# Patient Record
Sex: Female | Born: 1960 | Race: White | Hispanic: No | Marital: Single | Smoking: Current every day smoker
Health system: Southern US, Community
[De-identification: ages and names within clinical notes are randomized; demographics above are authoritative.]

## PROBLEM LIST (undated history)

## (undated) DIAGNOSIS — I341 Nonrheumatic mitral (valve) prolapse: Secondary | ICD-10-CM

## (undated) DIAGNOSIS — B009 Herpesviral infection, unspecified: Secondary | ICD-10-CM

## (undated) DIAGNOSIS — M1990 Unspecified osteoarthritis, unspecified site: Secondary | ICD-10-CM

## (undated) DIAGNOSIS — G40209 Localization-related (focal) (partial) symptomatic epilepsy and epileptic syndromes with complex partial seizures, not intractable, without status epilepticus: Secondary | ICD-10-CM

## (undated) DIAGNOSIS — G40909 Epilepsy, unspecified, not intractable, without status epilepticus: Secondary | ICD-10-CM

## (undated) HISTORY — PX: NECK SURGERY: SHX720

## (undated) HISTORY — PX: FEMUR FRACTURE SURGERY: SHX633

## (undated) HISTORY — PX: RADICAL HYSTERECTOMY: SHX2283

---

## 2007-02-01 ENCOUNTER — Emergency Department (HOSPITAL_COMMUNITY): Admission: EM | Admit: 2007-02-01 | Discharge: 2007-02-02 | Payer: Self-pay | Admitting: Emergency Medicine

## 2007-08-22 ENCOUNTER — Emergency Department (HOSPITAL_COMMUNITY): Admission: EM | Admit: 2007-08-22 | Discharge: 2007-08-22 | Payer: Self-pay | Admitting: Emergency Medicine

## 2007-09-04 ENCOUNTER — Emergency Department (HOSPITAL_COMMUNITY): Admission: EM | Admit: 2007-09-04 | Discharge: 2007-09-05 | Payer: Self-pay | Admitting: Emergency Medicine

## 2007-09-13 ENCOUNTER — Emergency Department (HOSPITAL_COMMUNITY): Admission: EM | Admit: 2007-09-13 | Discharge: 2007-09-13 | Payer: Self-pay | Admitting: Emergency Medicine

## 2007-09-17 ENCOUNTER — Encounter (INDEPENDENT_AMBULATORY_CARE_PROVIDER_SITE_OTHER): Payer: Self-pay | Admitting: Internal Medicine

## 2007-09-18 ENCOUNTER — Encounter (INDEPENDENT_AMBULATORY_CARE_PROVIDER_SITE_OTHER): Payer: Self-pay | Admitting: Internal Medicine

## 2007-10-13 ENCOUNTER — Encounter (INDEPENDENT_AMBULATORY_CARE_PROVIDER_SITE_OTHER): Payer: Self-pay | Admitting: Internal Medicine

## 2007-10-13 ENCOUNTER — Emergency Department (HOSPITAL_COMMUNITY): Admission: EM | Admit: 2007-10-13 | Discharge: 2007-10-13 | Payer: Self-pay | Admitting: Emergency Medicine

## 2007-10-29 ENCOUNTER — Emergency Department (HOSPITAL_COMMUNITY): Admission: EM | Admit: 2007-10-29 | Discharge: 2007-10-29 | Payer: Self-pay | Admitting: Emergency Medicine

## 2007-11-07 ENCOUNTER — Emergency Department (HOSPITAL_COMMUNITY): Admission: EM | Admit: 2007-11-07 | Discharge: 2007-11-07 | Payer: Self-pay | Admitting: Family Medicine

## 2007-11-08 ENCOUNTER — Ambulatory Visit: Payer: Self-pay | Admitting: Internal Medicine

## 2007-11-16 ENCOUNTER — Emergency Department (HOSPITAL_COMMUNITY): Admission: EM | Admit: 2007-11-16 | Discharge: 2007-11-16 | Payer: Self-pay | Admitting: Emergency Medicine

## 2007-11-21 ENCOUNTER — Emergency Department (HOSPITAL_COMMUNITY): Admission: EM | Admit: 2007-11-21 | Discharge: 2007-11-21 | Payer: Self-pay | Admitting: Family Medicine

## 2007-12-21 ENCOUNTER — Emergency Department (HOSPITAL_COMMUNITY): Admission: EM | Admit: 2007-12-21 | Discharge: 2007-12-21 | Payer: Self-pay | Admitting: Emergency Medicine

## 2008-01-19 ENCOUNTER — Emergency Department (HOSPITAL_COMMUNITY): Admission: EM | Admit: 2008-01-19 | Discharge: 2008-01-19 | Payer: Self-pay | Admitting: Emergency Medicine

## 2008-02-07 ENCOUNTER — Emergency Department (HOSPITAL_COMMUNITY): Admission: EM | Admit: 2008-02-07 | Discharge: 2008-02-08 | Payer: Self-pay | Admitting: Emergency Medicine

## 2008-02-11 ENCOUNTER — Emergency Department (HOSPITAL_COMMUNITY): Admission: EM | Admit: 2008-02-11 | Discharge: 2008-02-11 | Payer: Self-pay | Admitting: Emergency Medicine

## 2008-02-16 ENCOUNTER — Emergency Department (HOSPITAL_COMMUNITY): Admission: EM | Admit: 2008-02-16 | Discharge: 2008-02-16 | Payer: Self-pay | Admitting: Emergency Medicine

## 2008-02-25 ENCOUNTER — Ambulatory Visit: Payer: Self-pay | Admitting: Internal Medicine

## 2008-02-25 LAB — CONVERTED CEMR LAB
Bilirubin Urine: NEGATIVE
Glucose, Urine, Semiquant: NEGATIVE
Ketones, urine, test strip: NEGATIVE
Specific Gravity, Urine: <1.005
pH: 6

## 2008-03-02 ENCOUNTER — Emergency Department (HOSPITAL_COMMUNITY): Admission: EM | Admit: 2008-03-02 | Discharge: 2008-03-02 | Payer: Self-pay | Admitting: Emergency Medicine

## 2008-03-04 ENCOUNTER — Emergency Department (HOSPITAL_COMMUNITY): Admission: EM | Admit: 2008-03-04 | Discharge: 2008-03-05 | Payer: Self-pay | Admitting: Emergency Medicine

## 2008-03-05 ENCOUNTER — Inpatient Hospital Stay (HOSPITAL_COMMUNITY): Admission: EM | Admit: 2008-03-05 | Discharge: 2008-03-09 | Payer: Self-pay | Admitting: *Deleted

## 2008-03-05 ENCOUNTER — Ambulatory Visit: Payer: Self-pay | Admitting: *Deleted

## 2008-03-09 LAB — CONVERTED CEMR LAB
ALT: 8 units/L (ref 0–35)
BUN: 11 mg/dL (ref 6–23)
Basophils Absolute: 0 10*3/uL (ref 0.0–0.1)
CO2: 20 meq/L (ref 19–32)
Calcium: 9.7 mg/dL (ref 8.4–10.5)
Chloride: 106 meq/L (ref 96–112)
Cholesterol: 208 mg/dL — ABNORMAL HIGH (ref 0–200)
Creatinine, Ser: 0.6 mg/dL (ref 0.40–1.20)
Eosinophils Relative: 1 % (ref 0–5)
Glucose, Bld: 79 mg/dL (ref 70–99)
HCT: 47.3 % — ABNORMAL HIGH (ref 36.0–46.0)
Hemoglobin: 15.9 g/dL — ABNORMAL HIGH (ref 12.0–15.0)
Lymphocytes Relative: 44 % (ref 12–46)
Lymphs Abs: 2.4 10*3/uL (ref 0.7–4.0)
Monocytes Absolute: 0.5 10*3/uL (ref 0.1–1.0)
Monocytes Relative: 9 % (ref 3–12)
Neutro Abs: 2.5 10*3/uL (ref 1.7–7.7)
RBC: 4.48 M/uL (ref 3.87–5.11)
Total CHOL/HDL Ratio: 3.1
Triglycerides: 60 mg/dL (ref <150)

## 2008-03-14 ENCOUNTER — Emergency Department (HOSPITAL_COMMUNITY): Admission: EM | Admit: 2008-03-14 | Discharge: 2008-03-14 | Payer: Self-pay | Admitting: Emergency Medicine

## 2009-04-06 ENCOUNTER — Emergency Department (HOSPITAL_COMMUNITY): Admission: EM | Admit: 2009-04-06 | Discharge: 2009-04-06 | Payer: Self-pay | Admitting: Family Medicine

## 2009-05-19 ENCOUNTER — Emergency Department (HOSPITAL_COMMUNITY): Admission: EM | Admit: 2009-05-19 | Discharge: 2009-05-19 | Payer: Self-pay | Admitting: Emergency Medicine

## 2009-06-13 ENCOUNTER — Emergency Department (HOSPITAL_COMMUNITY): Admission: EM | Admit: 2009-06-13 | Discharge: 2009-06-13 | Payer: Self-pay | Admitting: Emergency Medicine

## 2009-06-19 ENCOUNTER — Emergency Department (HOSPITAL_COMMUNITY): Admission: EM | Admit: 2009-06-19 | Discharge: 2009-06-19 | Payer: Self-pay | Admitting: Emergency Medicine

## 2009-06-28 ENCOUNTER — Emergency Department (HOSPITAL_COMMUNITY): Admission: EM | Admit: 2009-06-28 | Discharge: 2009-06-28 | Payer: Self-pay | Admitting: Emergency Medicine

## 2009-07-09 ENCOUNTER — Emergency Department (HOSPITAL_COMMUNITY): Admission: EM | Admit: 2009-07-09 | Discharge: 2009-07-09 | Payer: Self-pay | Admitting: Emergency Medicine

## 2009-07-23 ENCOUNTER — Emergency Department (HOSPITAL_COMMUNITY): Admission: EM | Admit: 2009-07-23 | Discharge: 2009-07-23 | Payer: Self-pay | Admitting: Family Medicine

## 2009-10-04 IMAGING — CR DG CHEST 2V
2 series · 2 of 2 positions shown · non-contrast
Comparison: 12/21/2007.  [DATE] [DATE].

CLINICAL DATA: Disoriented.  Shaking.  History of seizures.

CHEST - 2 VIEW

[w chest pa]
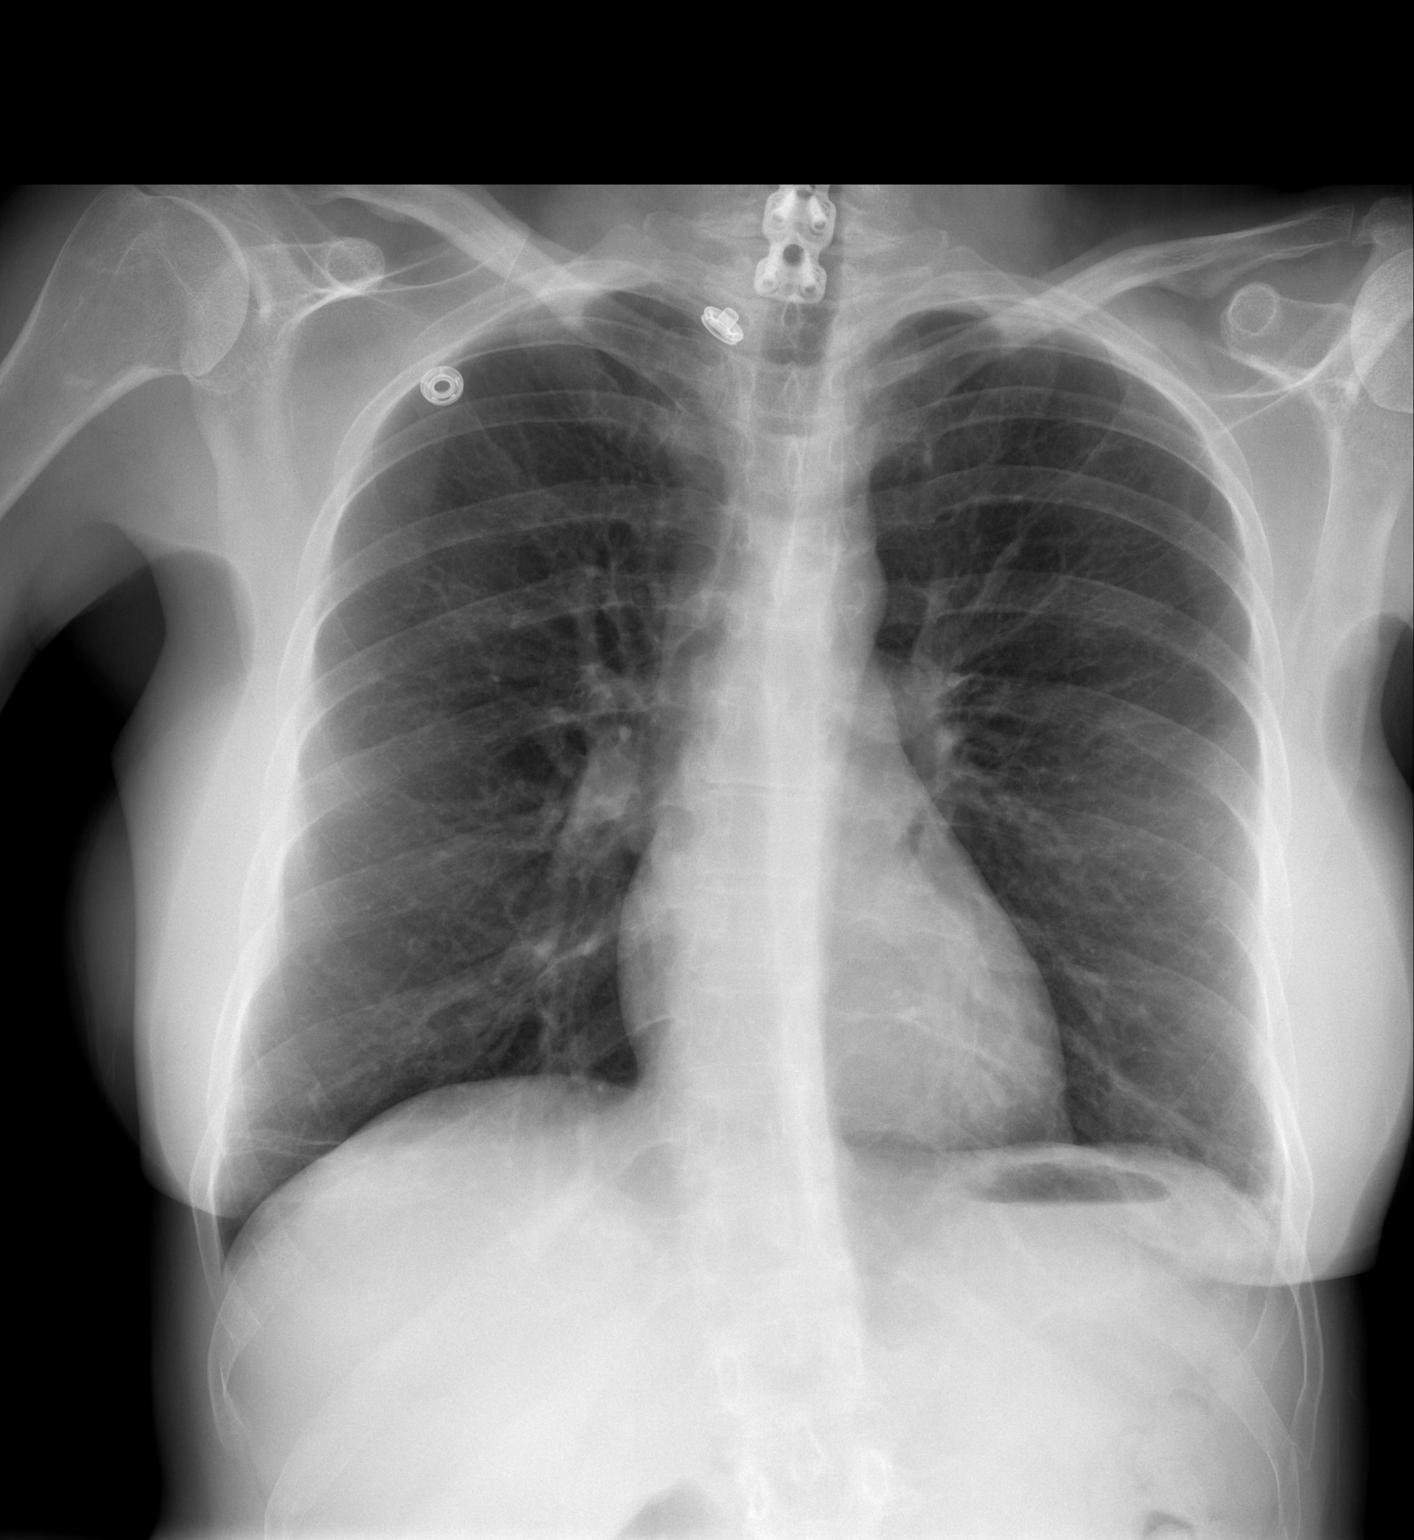

[w chest lat]
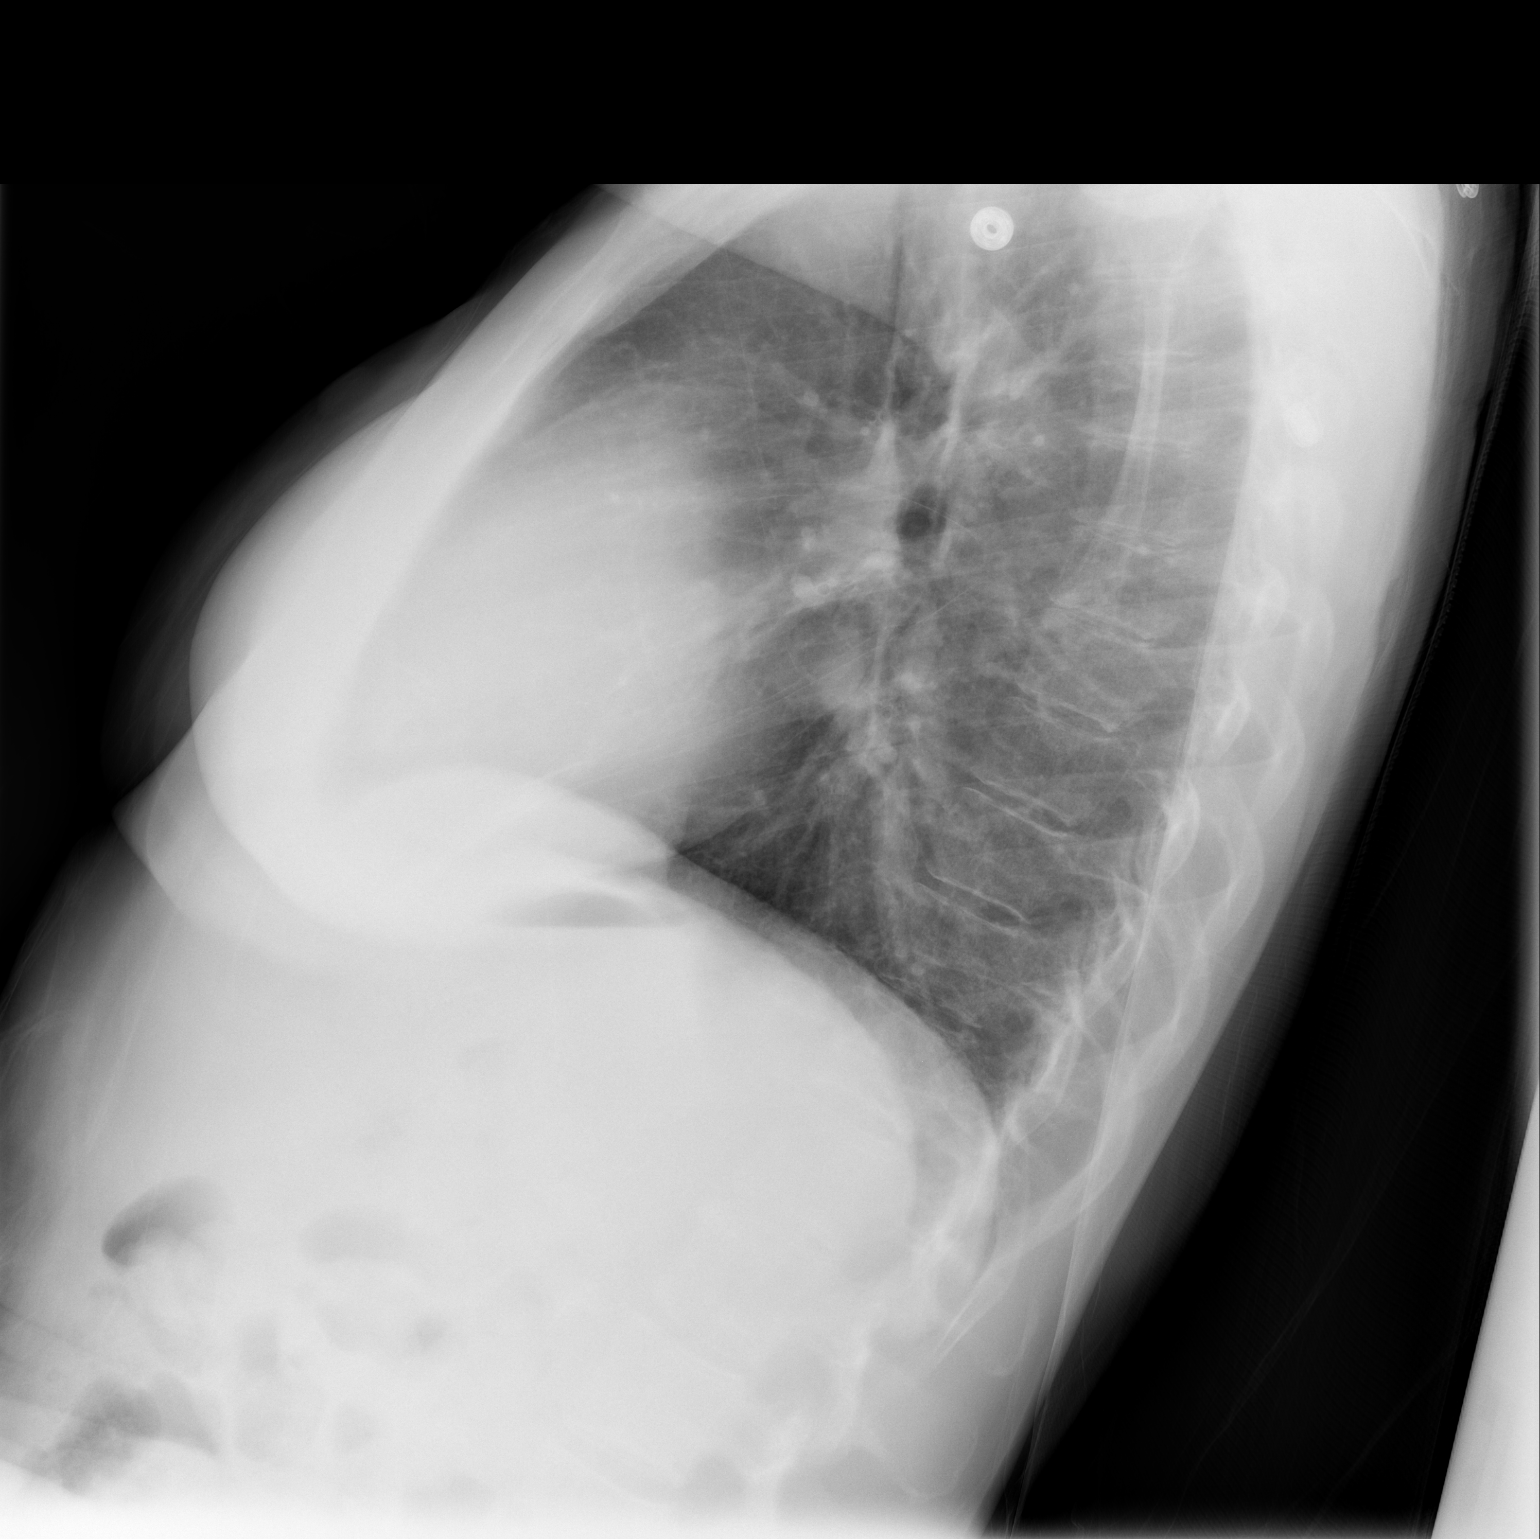

[2 of 2 positions shown; findings below may reference images not displayed]

FINDINGS: Heart size is normal.  The mediastinum is unremarkable.
There is minimal atelectasis at the lung bases.  The lungs are
otherwise clear.  No effusions.  Bony structures unremarkable.
IMPRESSION: Minimal atelectasis at the lung bases.  No active process
otherwise.

## 2010-02-08 NOTE — Letter (Signed)
Summary: PATIENT INFORMATION & HISTORY SHEET  PATIENT INFORMATION & HISTORY SHEET   Imported ByArta Bruce 11/20/2007 09:31:26  _____________________________________________________________________  External Attachment:    Type:   Image     Comment:   External Document

## 2010-02-11 ENCOUNTER — Emergency Department (HOSPITAL_COMMUNITY)
Admission: EM | Admit: 2010-02-11 | Discharge: 2010-02-11 | Disposition: A | Discharge status: Home / Self Care | Payer: Self-pay | Attending: Emergency Medicine | Admitting: Emergency Medicine

## 2010-02-11 DIAGNOSIS — N644 Mastodynia: Secondary | ICD-10-CM | POA: Insufficient documentation

## 2010-02-11 DIAGNOSIS — G8918 Other acute postprocedural pain: Secondary | ICD-10-CM | POA: Insufficient documentation

## 2010-02-11 DIAGNOSIS — Z9889 Other specified postprocedural states: Secondary | ICD-10-CM | POA: Insufficient documentation

## 2010-04-06 ENCOUNTER — Inpatient Hospital Stay (INDEPENDENT_AMBULATORY_CARE_PROVIDER_SITE_OTHER)
Admission: RE | Admit: 2010-04-06 | Discharge: 2010-04-06 | Disposition: A | Discharge status: Home / Self Care | Payer: Self-pay | Source: Ambulatory Visit | Attending: Emergency Medicine | Admitting: Emergency Medicine

## 2010-04-06 DIAGNOSIS — K047 Periapical abscess without sinus: Secondary | ICD-10-CM

## 2010-04-21 LAB — COMPREHENSIVE METABOLIC PANEL
Albumin: 3.2 g/dL — ABNORMAL LOW (ref 3.5–5.2)
BUN: 7 mg/dL (ref 6–23)
CO2: 30 mEq/L (ref 19–32)
Chloride: 97 mEq/L (ref 96–112)
Creatinine, Ser: 0.54 mg/dL (ref 0.4–1.2)
GFR calc non Af Amer: >60 mL/min (ref >60)
Total Bilirubin: 0.6 mg/dL (ref 0.3–1.2)

## 2010-04-21 LAB — DIFFERENTIAL
Basophils Absolute: 0 10*3/uL (ref 0.0–0.1)
Lymphocytes Relative: 15 % (ref 12–46)
Neutro Abs: 5.4 10*3/uL (ref 1.7–7.7)
Neutrophils Relative %: 72 % (ref 43–77)

## 2010-04-21 LAB — CBC
HCT: 40.3 % (ref 36.0–46.0)
MCV: 103.8 fL — ABNORMAL HIGH (ref 78.0–100.0)
Platelets: 110 10*3/uL — ABNORMAL LOW (ref 150–400)
WBC: 7.5 10*3/uL (ref 4.0–10.5)

## 2010-04-25 LAB — POCT PREGNANCY, URINE: Preg Test, Ur: NEGATIVE

## 2010-04-25 LAB — URINE MICROSCOPIC-ADD ON

## 2010-04-25 LAB — DIFFERENTIAL
Basophils Absolute: 0.2 10*3/uL — ABNORMAL HIGH (ref 0.0–0.1)
Basophils Relative: 3 % — ABNORMAL HIGH (ref 0–1)
Eosinophils Absolute: 0 10*3/uL (ref 0.0–0.7)
Monocytes Absolute: 0.5 10*3/uL (ref 0.1–1.0)
Neutro Abs: 4.2 10*3/uL (ref 1.7–7.7)
Neutrophils Relative %: 71 % (ref 43–77)

## 2010-04-25 LAB — POCT I-STAT, CHEM 8
Calcium, Ion: 1.1 mmol/L — ABNORMAL LOW (ref 1.12–1.32)
Chloride: 103 mEq/L (ref 96–112)
HCT: 43 % (ref 36.0–46.0)
Hemoglobin: 14.6 g/dL (ref 12.0–15.0)

## 2010-04-25 LAB — CBC
Hemoglobin: 14.6 g/dL (ref 12.0–15.0)
MCHC: 34.7 g/dL (ref 30.0–36.0)
MCV: 104.4 fL — ABNORMAL HIGH (ref 78.0–100.0)
RDW: 13.7 % (ref 11.5–15.5)

## 2010-04-25 LAB — URINALYSIS, ROUTINE W REFLEX MICROSCOPIC
Nitrite: NEGATIVE
Specific Gravity, Urine: 1.002 — ABNORMAL LOW (ref 1.005–1.030)
Urobilinogen, UA: 0.2 mg/dL (ref 0.0–1.0)

## 2010-04-26 LAB — DIFFERENTIAL
Basophils Absolute: 0 10*3/uL (ref 0.0–0.1)
Basophils Relative: 1 % (ref 0–1)
Eosinophils Absolute: 0.1 10*3/uL (ref 0.0–0.7)
Lymphocytes Relative: 39 % (ref 12–46)
Lymphocytes Relative: 43 % (ref 12–46)
Lymphocytes Relative: 51 % — ABNORMAL HIGH (ref 12–46)
Lymphs Abs: 2.4 10*3/uL (ref 0.7–4.0)
Lymphs Abs: 2.4 10*3/uL (ref 0.7–4.0)
Monocytes Absolute: 0.5 10*3/uL (ref 0.1–1.0)
Monocytes Relative: 8 % (ref 3–12)
Neutro Abs: 3.1 10*3/uL (ref 1.7–7.7)
Neutro Abs: 1.4 10*3/uL — ABNORMAL LOW (ref 1.7–7.7)
Neutro Abs: 2.4 10*3/uL (ref 1.7–7.7)
Neutrophils Relative %: 51 % (ref 43–77)
Neutrophils Relative %: 31 % — ABNORMAL LOW (ref 43–77)
Neutrophils Relative %: 42 % — ABNORMAL LOW (ref 43–77)

## 2010-04-26 LAB — POCT I-STAT, CHEM 8
BUN: <3 mg/dL — ABNORMAL LOW (ref 6–23)
Chloride: 100 mEq/L (ref 96–112)
Creatinine, Ser: 0.7 mg/dL (ref 0.4–1.2)
Glucose, Bld: 89 mg/dL (ref 70–99)
HCT: 47 % — ABNORMAL HIGH (ref 36.0–46.0)
Potassium: 3.8 mEq/L (ref 3.5–5.1)

## 2010-04-26 LAB — POCT PREGNANCY, URINE: Preg Test, Ur: NEGATIVE

## 2010-04-26 LAB — HEPATIC FUNCTION PANEL
ALT: 11 U/L (ref 0–35)
Alkaline Phosphatase: 37 U/L — ABNORMAL LOW (ref 39–117)
Bilirubin, Direct: 0.2 mg/dL (ref 0.0–0.3)
Indirect Bilirubin: 0.4 mg/dL (ref 0.3–0.9)
Total Bilirubin: 0.6 mg/dL (ref 0.3–1.2)

## 2010-04-26 LAB — CBC
Hemoglobin: 15.3 g/dL — ABNORMAL HIGH (ref 12.0–15.0)
MCHC: 34.6 g/dL (ref 30.0–36.0)
MCHC: 34.2 g/dL (ref 30.0–36.0)
MCV: 105.3 fL — ABNORMAL HIGH (ref 78.0–100.0)
Platelets: 149 10*3/uL — ABNORMAL LOW (ref 150–400)
Platelets: 144 10*3/uL — ABNORMAL LOW (ref 150–400)
RBC: 4.29 MIL/uL (ref 3.87–5.11)
RBC: 3.61 MIL/uL — ABNORMAL LOW (ref 3.87–5.11)
RDW: 14.1 % (ref 11.5–15.5)
WBC: 6.2 10*3/uL (ref 4.0–10.5)
WBC: 5.6 10*3/uL (ref 4.0–10.5)
WBC: 4.9 10*3/uL (ref 4.0–10.5)

## 2010-04-26 LAB — URINE CULTURE
Colony Count: NO GROWTH
Culture: NO GROWTH

## 2010-04-26 LAB — RAPID URINE DRUG SCREEN, HOSP PERFORMED
Barbiturates: NOT DETECTED
Benzodiazepines: POSITIVE — AB
Cocaine: POSITIVE — AB
Opiates: POSITIVE — AB

## 2010-04-26 LAB — URINALYSIS, ROUTINE W REFLEX MICROSCOPIC
Glucose, UA: NEGATIVE mg/dL
Ketones, ur: NEGATIVE mg/dL
Nitrite: NEGATIVE
Specific Gravity, Urine: 1.004 — ABNORMAL LOW (ref 1.005–1.030)
Urobilinogen, UA: 0.2 mg/dL (ref 0.0–1.0)
pH: 7.5 (ref 5.0–8.0)
pH: 6 (ref 5.0–8.0)

## 2010-04-26 LAB — BASIC METABOLIC PANEL
BUN: 6 mg/dL (ref 6–23)
CO2: 31 mEq/L (ref 19–32)
Calcium: 8.5 mg/dL (ref 8.4–10.5)
Calcium: 8.5 mg/dL (ref 8.4–10.5)
Creatinine, Ser: 0.59 mg/dL (ref 0.4–1.2)
Creatinine, Ser: 0.61 mg/dL (ref 0.4–1.2)
GFR calc Af Amer: >60 mL/min (ref >60)
GFR calc Af Amer: >60 mL/min (ref >60)
GFR calc non Af Amer: >60 mL/min (ref >60)
GFR calc non Af Amer: >60 mL/min (ref >60)
Sodium: 144 mEq/L (ref 135–145)

## 2010-04-26 LAB — ETHANOL: Alcohol, Ethyl (B): <5 mg/dL (ref 0–10)

## 2010-04-26 LAB — FOLATE: Folate: 9.3 ng/mL

## 2010-04-26 LAB — TSH: TSH: 0.267 u[IU]/mL — ABNORMAL LOW (ref 0.350–4.500)

## 2010-04-26 LAB — VALPROIC ACID LEVEL: Valproic Acid Lvl: 86 ug/mL (ref 50.0–100.0)

## 2010-05-23 ENCOUNTER — Ambulatory Visit
Admission: RE | Admit: 2010-05-23 | Discharge: 2010-05-23 | Disposition: A | Discharge status: Home / Self Care | Payer: No Typology Code available for payment source | Source: Ambulatory Visit | Attending: Internal Medicine | Admitting: Internal Medicine

## 2010-05-23 ENCOUNTER — Other Ambulatory Visit: Payer: Self-pay | Admitting: Internal Medicine

## 2010-05-23 DIAGNOSIS — M549 Dorsalgia, unspecified: Secondary | ICD-10-CM
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
## 2010-05-29 ENCOUNTER — Emergency Department (HOSPITAL_COMMUNITY)
Admission: EM | Admit: 2010-05-29 | Discharge: 2010-05-29 | Disposition: A | Discharge status: Home / Self Care | Payer: Self-pay | Attending: Emergency Medicine | Admitting: Emergency Medicine

## 2010-05-29 DIAGNOSIS — G8929 Other chronic pain: Secondary | ICD-10-CM | POA: Insufficient documentation

## 2010-05-29 DIAGNOSIS — M549 Dorsalgia, unspecified: Secondary | ICD-10-CM | POA: Insufficient documentation

## 2010-05-29 DIAGNOSIS — G40909 Epilepsy, unspecified, not intractable, without status epilepticus: Secondary | ICD-10-CM | POA: Insufficient documentation

## 2010-06-06 ENCOUNTER — Emergency Department (HOSPITAL_COMMUNITY)
Admission: EM | Admit: 2010-06-06 | Discharge: 2010-06-06 | Disposition: A | Discharge status: Home / Self Care | Payer: Self-pay | Attending: Emergency Medicine | Admitting: Emergency Medicine

## 2010-06-06 DIAGNOSIS — F329 Major depressive disorder, single episode, unspecified: Secondary | ICD-10-CM | POA: Insufficient documentation

## 2010-06-06 DIAGNOSIS — IMO0002 Reserved for concepts with insufficient information to code with codable children: Secondary | ICD-10-CM | POA: Insufficient documentation

## 2010-06-06 DIAGNOSIS — G40909 Epilepsy, unspecified, not intractable, without status epilepticus: Secondary | ICD-10-CM | POA: Insufficient documentation

## 2010-06-06 DIAGNOSIS — M545 Low back pain, unspecified: Secondary | ICD-10-CM | POA: Insufficient documentation

## 2010-06-06 DIAGNOSIS — Z79899 Other long term (current) drug therapy: Secondary | ICD-10-CM | POA: Insufficient documentation

## 2010-06-06 DIAGNOSIS — F3289 Other specified depressive episodes: Secondary | ICD-10-CM | POA: Insufficient documentation

## 2010-06-06 DIAGNOSIS — H6690 Otitis media, unspecified, unspecified ear: Secondary | ICD-10-CM | POA: Insufficient documentation

## 2010-06-06 DIAGNOSIS — M546 Pain in thoracic spine: Secondary | ICD-10-CM | POA: Insufficient documentation

## 2010-08-11 ENCOUNTER — Emergency Department (HOSPITAL_COMMUNITY): Payer: Self-pay

## 2010-08-11 ENCOUNTER — Emergency Department (HOSPITAL_COMMUNITY)
Admission: EM | Admit: 2010-08-11 | Discharge: 2010-08-12 | Disposition: A | Discharge status: Home / Self Care | Payer: Self-pay | Attending: Emergency Medicine | Admitting: Emergency Medicine

## 2010-08-11 DIAGNOSIS — S0083XA Contusion of other part of head, initial encounter: Secondary | ICD-10-CM | POA: Insufficient documentation

## 2010-08-11 DIAGNOSIS — IMO0002 Reserved for concepts with insufficient information to code with codable children: Secondary | ICD-10-CM | POA: Insufficient documentation

## 2010-08-11 DIAGNOSIS — S8010XA Contusion of unspecified lower leg, initial encounter: Secondary | ICD-10-CM | POA: Insufficient documentation

## 2010-08-11 DIAGNOSIS — Y9229 Other specified public building as the place of occurrence of the external cause: Secondary | ICD-10-CM | POA: Insufficient documentation

## 2010-08-11 DIAGNOSIS — R569 Unspecified convulsions: Secondary | ICD-10-CM | POA: Insufficient documentation

## 2010-08-11 DIAGNOSIS — S0003XA Contusion of scalp, initial encounter: Secondary | ICD-10-CM | POA: Insufficient documentation

## 2010-08-11 DIAGNOSIS — S01309A Unspecified open wound of unspecified ear, initial encounter: Secondary | ICD-10-CM | POA: Insufficient documentation

## 2010-08-11 DIAGNOSIS — S060X1A Concussion with loss of consciousness of 30 minutes or less, initial encounter: Secondary | ICD-10-CM | POA: Insufficient documentation

## 2010-08-11 LAB — POCT I-STAT, CHEM 8
BUN: 8 mg/dL (ref 6–23)
Chloride: 101 mEq/L (ref 96–112)
HCT: 45 % (ref 36.0–46.0)
Potassium: 3.8 mEq/L (ref 3.5–5.1)
Sodium: 134 mEq/L — ABNORMAL LOW (ref 135–145)
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
## 2010-09-03 ENCOUNTER — Emergency Department (HOSPITAL_COMMUNITY)
Admission: EM | Admit: 2010-09-03 | Discharge: 2010-09-04 | Disposition: A | Discharge status: Home / Self Care | Payer: Self-pay | Attending: Emergency Medicine | Admitting: Emergency Medicine

## 2010-09-03 ENCOUNTER — Emergency Department (HOSPITAL_COMMUNITY): Payer: Self-pay

## 2010-09-03 DIAGNOSIS — F1010 Alcohol abuse, uncomplicated: Secondary | ICD-10-CM | POA: Insufficient documentation

## 2010-09-03 DIAGNOSIS — R5381 Other malaise: Secondary | ICD-10-CM | POA: Insufficient documentation

## 2010-09-03 DIAGNOSIS — R569 Unspecified convulsions: Secondary | ICD-10-CM | POA: Insufficient documentation

## 2010-09-03 DIAGNOSIS — R5383 Other fatigue: Secondary | ICD-10-CM | POA: Insufficient documentation

## 2010-09-04 LAB — URINALYSIS, ROUTINE W REFLEX MICROSCOPIC
Nitrite: NEGATIVE
Specific Gravity, Urine: 1.003 — ABNORMAL LOW (ref 1.005–1.030)
Urobilinogen, UA: 0.2 mg/dL (ref 0.0–1.0)
pH: 5.5 (ref 5.0–8.0)

## 2010-09-04 LAB — CBC
HCT: 42.3 % (ref 36.0–46.0)
Hemoglobin: 14.6 g/dL (ref 12.0–15.0)
RBC: 4.12 MIL/uL (ref 3.87–5.11)
RDW: 14.6 % (ref 11.5–15.5)
WBC: 5 10*3/uL (ref 4.0–10.5)

## 2010-09-04 LAB — POCT PREGNANCY, URINE: Preg Test, Ur: NEGATIVE

## 2010-09-04 LAB — URINE MICROSCOPIC-ADD ON

## 2010-09-04 LAB — RAPID URINE DRUG SCREEN, HOSP PERFORMED
Amphetamines: NOT DETECTED
Barbiturates: POSITIVE — AB
Benzodiazepines: NOT DETECTED
Cocaine: NOT DETECTED
Tetrahydrocannabinol: NOT DETECTED

## 2010-09-04 LAB — DIFFERENTIAL
Basophils Absolute: 0 10*3/uL (ref 0.0–0.1)
Eosinophils Relative: 2 % (ref 0–5)
Lymphocytes Relative: 50 % — ABNORMAL HIGH (ref 12–46)
Neutro Abs: 1.9 10*3/uL (ref 1.7–7.7)
Neutrophils Relative %: 37 % — ABNORMAL LOW (ref 43–77)

## 2010-09-04 LAB — COMPREHENSIVE METABOLIC PANEL
ALT: 20 U/L (ref 0–35)
AST: 26 U/L (ref 0–37)
Albumin: 3.4 g/dL — ABNORMAL LOW (ref 3.5–5.2)
Alkaline Phosphatase: 84 U/L (ref 39–117)
CO2: 27 mEq/L (ref 19–32)
Glucose, Bld: 83 mg/dL (ref 70–99)
Total Bilirubin: 0.1 mg/dL — ABNORMAL LOW (ref 0.3–1.2)
Total Protein: 6 g/dL (ref 6.0–8.3)

## 2010-09-04 LAB — ACETAMINOPHEN LEVEL: Acetaminophen (Tylenol), Serum: <15 ug/mL (ref 10–30)

## 2010-09-04 LAB — ETHANOL: Alcohol, Ethyl (B): 239 mg/dL — ABNORMAL HIGH (ref 0–11)

## 2010-09-09 ENCOUNTER — Emergency Department (HOSPITAL_COMMUNITY)
Admission: EM | Admit: 2010-09-09 | Discharge: 2010-09-09 | Disposition: A | Discharge status: Home / Self Care | Payer: Self-pay | Attending: Emergency Medicine | Admitting: Emergency Medicine

## 2010-09-09 DIAGNOSIS — M545 Low back pain, unspecified: Secondary | ICD-10-CM | POA: Insufficient documentation

## 2010-09-09 DIAGNOSIS — G35 Multiple sclerosis: Secondary | ICD-10-CM | POA: Insufficient documentation

## 2010-09-09 DIAGNOSIS — G40909 Epilepsy, unspecified, not intractable, without status epilepticus: Secondary | ICD-10-CM | POA: Insufficient documentation

## 2010-09-09 DIAGNOSIS — M549 Dorsalgia, unspecified: Secondary | ICD-10-CM | POA: Insufficient documentation

## 2010-09-09 DIAGNOSIS — F1010 Alcohol abuse, uncomplicated: Secondary | ICD-10-CM | POA: Insufficient documentation

## 2010-10-02 ENCOUNTER — Emergency Department (HOSPITAL_COMMUNITY)
Admission: EM | Admit: 2010-10-02 | Discharge: 2010-10-02 | Disposition: A | Discharge status: Home / Self Care | Payer: Self-pay | Attending: Emergency Medicine | Admitting: Emergency Medicine

## 2010-10-02 DIAGNOSIS — Z760 Encounter for issue of repeat prescription: Secondary | ICD-10-CM | POA: Insufficient documentation

## 2010-10-02 DIAGNOSIS — M62838 Other muscle spasm: Secondary | ICD-10-CM | POA: Insufficient documentation

## 2010-10-02 DIAGNOSIS — G40909 Epilepsy, unspecified, not intractable, without status epilepticus: Secondary | ICD-10-CM | POA: Insufficient documentation

## 2010-10-11 LAB — HIV ANTIBODY (ROUTINE TESTING W REFLEX): HIV: NONREACTIVE

## 2010-10-12 LAB — POCT I-STAT, CHEM 8
Calcium, Ion: 1.15
Chloride: 105
Glucose, Bld: 84
HCT: 43
Hemoglobin: 14.6
TCO2: 27

## 2010-10-29 ENCOUNTER — Inpatient Hospital Stay (HOSPITAL_COMMUNITY)
Admission: EM | Admit: 2010-10-29 | Discharge: 2010-11-01 | DRG: 101 | Disposition: A | Discharge status: Home / Self Care | Payer: Self-pay | Attending: Internal Medicine | Admitting: Internal Medicine

## 2010-10-29 ENCOUNTER — Emergency Department (HOSPITAL_COMMUNITY): Payer: Self-pay

## 2010-10-29 DIAGNOSIS — E876 Hypokalemia: Secondary | ICD-10-CM | POA: Diagnosis present

## 2010-10-29 DIAGNOSIS — Z5900 Homelessness unspecified: Secondary | ICD-10-CM

## 2010-10-29 DIAGNOSIS — R112 Nausea with vomiting, unspecified: Secondary | ICD-10-CM | POA: Diagnosis present

## 2010-10-29 DIAGNOSIS — R0682 Tachypnea, not elsewhere classified: Secondary | ICD-10-CM | POA: Diagnosis present

## 2010-10-29 DIAGNOSIS — F329 Major depressive disorder, single episode, unspecified: Secondary | ICD-10-CM | POA: Diagnosis present

## 2010-10-29 DIAGNOSIS — A498 Other bacterial infections of unspecified site: Secondary | ICD-10-CM | POA: Diagnosis present

## 2010-10-29 DIAGNOSIS — G40309 Generalized idiopathic epilepsy and epileptic syndromes, not intractable, without status epilepticus: Principal | ICD-10-CM | POA: Diagnosis present

## 2010-10-29 DIAGNOSIS — N390 Urinary tract infection, site not specified: Secondary | ICD-10-CM | POA: Diagnosis present

## 2010-10-29 DIAGNOSIS — F10939 Alcohol use, unspecified with withdrawal, unspecified: Secondary | ICD-10-CM | POA: Diagnosis present

## 2010-10-29 DIAGNOSIS — R000 Tachycardia, unspecified: Secondary | ICD-10-CM | POA: Diagnosis present

## 2010-10-29 DIAGNOSIS — F1020 Alcohol dependence, uncomplicated: Secondary | ICD-10-CM | POA: Diagnosis present

## 2010-10-29 DIAGNOSIS — R030 Elevated blood-pressure reading, without diagnosis of hypertension: Secondary | ICD-10-CM | POA: Diagnosis present

## 2010-10-29 DIAGNOSIS — R51 Headache: Secondary | ICD-10-CM | POA: Diagnosis present

## 2010-10-29 DIAGNOSIS — F1010 Alcohol abuse, uncomplicated: Secondary | ICD-10-CM | POA: Diagnosis present

## 2010-10-29 DIAGNOSIS — Z9119 Patient's noncompliance with other medical treatment and regimen: Secondary | ICD-10-CM

## 2010-10-29 DIAGNOSIS — F3289 Other specified depressive episodes: Secondary | ICD-10-CM | POA: Diagnosis present

## 2010-10-29 DIAGNOSIS — F1910 Other psychoactive substance abuse, uncomplicated: Secondary | ICD-10-CM | POA: Diagnosis present

## 2010-10-29 DIAGNOSIS — F17200 Nicotine dependence, unspecified, uncomplicated: Secondary | ICD-10-CM | POA: Diagnosis present

## 2010-10-29 DIAGNOSIS — F10239 Alcohol dependence with withdrawal, unspecified: Secondary | ICD-10-CM | POA: Diagnosis present

## 2010-10-29 DIAGNOSIS — Z91199 Patient's noncompliance with other medical treatment and regimen due to unspecified reason: Secondary | ICD-10-CM

## 2010-10-29 LAB — DIFFERENTIAL
Basophils Relative: 0 % (ref 0–1)
Eosinophils Absolute: 0 10*3/uL (ref 0.0–0.7)
Eosinophils Relative: 0 % (ref 0–5)
Lymphs Abs: 0.9 10*3/uL (ref 0.7–4.0)
Neutrophils Relative %: 82 % — ABNORMAL HIGH (ref 43–77)

## 2010-10-29 LAB — CBC
MCV: 98.8 fL (ref 78.0–100.0)
Platelets: 152 10*3/uL (ref 150–400)
RBC: 4.13 MIL/uL (ref 3.87–5.11)
RDW: 14.9 % (ref 11.5–15.5)
WBC: 8.3 10*3/uL (ref 4.0–10.5)

## 2010-10-29 LAB — COMPREHENSIVE METABOLIC PANEL
Albumin: 3.6 g/dL (ref 3.5–5.2)
BUN: 14 mg/dL (ref 6–23)
Calcium: 8.5 mg/dL (ref 8.4–10.5)
GFR calc Af Amer: >90 mL/min (ref >90)
Glucose, Bld: 126 mg/dL — ABNORMAL HIGH (ref 70–99)
Sodium: 134 mEq/L — ABNORMAL LOW (ref 135–145)
Total Protein: 6.3 g/dL (ref 6.0–8.3)

## 2010-10-29 LAB — POCT I-STAT TROPONIN I: Troponin i, poc: 0.01 ng/mL (ref 0.00–0.08)

## 2010-10-29 LAB — PHENOBARBITAL LEVEL: Phenobarbital: 14.3 ug/mL — ABNORMAL LOW (ref 15.0–40.0)

## 2010-10-30 ENCOUNTER — Inpatient Hospital Stay (HOSPITAL_COMMUNITY): Payer: Self-pay

## 2010-10-30 LAB — COMPREHENSIVE METABOLIC PANEL
ALT: 15 U/L (ref 0–35)
AST: 39 U/L — ABNORMAL HIGH (ref 0–37)
Albumin: 2.7 g/dL — ABNORMAL LOW (ref 3.5–5.2)
Alkaline Phosphatase: 101 U/L (ref 39–117)
Potassium: 3.2 mEq/L — ABNORMAL LOW (ref 3.5–5.1)
Sodium: 135 mEq/L (ref 135–145)
Total Protein: 5.1 g/dL — ABNORMAL LOW (ref 6.0–8.3)

## 2010-10-30 LAB — CBC
MCHC: 35.6 g/dL (ref 30.0–36.0)
Platelets: 126 10*3/uL — ABNORMAL LOW (ref 150–400)
RDW: 15 % (ref 11.5–15.5)
WBC: 8.9 10*3/uL (ref 4.0–10.5)

## 2010-10-30 LAB — RAPID URINE DRUG SCREEN, HOSP PERFORMED
Amphetamines: NOT DETECTED
Benzodiazepines: NOT DETECTED
Opiates: NOT DETECTED

## 2010-10-30 LAB — URINALYSIS, ROUTINE W REFLEX MICROSCOPIC
Glucose, UA: NEGATIVE mg/dL
Ketones, ur: NEGATIVE mg/dL
pH: 6 (ref 5.0–8.0)

## 2010-10-30 LAB — MAGNESIUM: Magnesium: 1.5 mg/dL (ref 1.5–2.5)

## 2010-10-30 LAB — DIFFERENTIAL
Basophils Absolute: 0 10*3/uL (ref 0.0–0.1)
Basophils Relative: 0 % (ref 0–1)
Eosinophils Relative: 1 % (ref 0–5)
Monocytes Absolute: 0.7 10*3/uL (ref 0.1–1.0)

## 2010-10-30 LAB — MRSA PCR SCREENING: MRSA by PCR: NEGATIVE

## 2010-10-30 LAB — URINE MICROSCOPIC-ADD ON

## 2010-10-31 ENCOUNTER — Inpatient Hospital Stay (HOSPITAL_COMMUNITY): Payer: Self-pay

## 2010-10-31 ENCOUNTER — Other Ambulatory Visit (HOSPITAL_COMMUNITY): Payer: Self-pay

## 2010-10-31 ENCOUNTER — Encounter (HOSPITAL_COMMUNITY): Payer: Self-pay

## 2010-10-31 LAB — URINE CULTURE: Special Requests: NEGATIVE

## 2010-10-31 LAB — BASIC METABOLIC PANEL
CO2: 24 mEq/L (ref 19–32)
Chloride: 104 mEq/L (ref 96–112)
GFR calc non Af Amer: >90 mL/min (ref >90)
Glucose, Bld: 102 mg/dL — ABNORMAL HIGH (ref 70–99)
Potassium: 3.2 mEq/L — ABNORMAL LOW (ref 3.5–5.1)
Sodium: 136 mEq/L (ref 135–145)

## 2010-10-31 NOTE — Consult Note (Signed)
NAMEVON, INSCOE NO.:  [id number]  MEDICAL RECORD NO.:  [id number]  LOCATION:  1231                         FACILITY:  [facility]  PHYSICIAN:  Beryle Beams, MD   DATE OF BIRTH:  Jul 11, 1960  DATE OF CONSULTATION:  10/29/2010 DATE OF DISCHARGE:                                CONSULTATION   HISTORY:  Ms. Ritter is a 50 year old female with a history of epilepsy, depression, alcohol, and tobacco abuse who is undergoing evaluation for recurrent seizures.  History is according to conservation with Dr. Blake Divine as well as review of the hospital chart.  Ms. Breau currently lives with her boyfriend in a tent.  Apparently, her epilepsy is well controlled with phenobarbital; however, she apparently ran out of this about a week ago.  Over the past week, her boyfriend indicates she has had intermittent episodes where she may twitch and fall, etc.  Today, she indicates she was in the tent and had an episode where she stiffened up, fell to ground, and began jerking all over.  He, at that time summoned 911 and she was taken to [facility]. During the episode she apparently bit her tongue and also bruised her right jaw area region.  In the emergency room, she underwent further testing including a phenobarbital level that was noted to be low at 14.3 and alcohol level less than 11.  Liver function tests including AST and ALT were normal, as well as normal alkaline phosphatase.  A head CT was obtained that was noted to be unremarkable without any significant abnormality.  Neurology was now consulted for further evaluation.  At the time of my dictation, she apparently had a last seizure almost 7 hours ago.  PAST MEDICAL HISTORY:  Significant for: 1. Epilepsy. 2. Depression. 3. Substance abuse. 4. Alcohol abuse. 5. Tobacco abuse. There is no noted history of diabetes, prior stroke, kidney or lung problems.  PAST SURGICAL HISTORY:  Includes: 1. Hysterectomy. 2.  Back surgery. 3. Ankle surgery. 4. Left leg surgery.  MEDICATIONS:  At this time include: 1. Folate. 2. Ativan. 3. Magnesium sulfate. 4. Ondansetron. 5. Phenobarbital 97.2 mg b.i.d. 6. Thiamine. 7. Hydralazine. 8. Oxycodone.  ALLERGIES:  No known drug allergies.  SOCIAL HISTORY:  She resides in [place] area.  She continues to smoke cigarettes and consumes alcohol on a daily basis.  FAMILY HISTORY:  Significant for coronary artery disease.  REVIEW OF SYSTEMS:  Neurological review of systems are as above.  PHYSICAL EXAMINATION:  GENERAL: Reveals a slightly disheveled-appearing middle-aged white female who appears older than her quoted age of 31. VITAL SIGNS: She is currently afebrile. Vital signs are stable. Blood pressure was 160/100. NEUROLOGIC:  She is alert and oriented with fluent speech.  She is able to name, repeat, and follow commands and otherwise, high-intellectual functioning appeared to be intact.  Her extraocular movements were intact.  Pupils equal, round, reactive to light.  Face is symmetric. Palate elevated symmetrically.  Tongue protrude in midline, otherwise cranial nerves II through XII appear to be intact.  On motor examination, she appears to have normal bulk and tone, 5/5 strength. Sensory exam reveals symmetric pinprick and vibration.  Cerebellum exam, on finger-to-nose there is no evidence of pronator drift.  Deep tendon reflexes are grade 1+.  The toes are ongoing. CARDIOVASCULAR:  Regular rate and rhythm without murmur, rub, or gallop. No evidence of carotid bruits. EXTREMITIES:  No evidence of cyanosis, clubbing, or edema. ABDOMEN:  Soft, nontender, nondistended with no active bowel sounds. LUNGS:  Clear to auscultation. SKIN:  Reveals no obvious cuts or abrasions; however, she does have a bruise over the right chin area.  Furthermore, there appears to be ecchymotic area of the tongue.  LABORATORY STUDIES:  Include low alcohol level less than  11.  A phenobarbital level of 14.3 as noted above.  Her CBC and chemistry-7 otherwise were mostly unremarkable.  ASSESSMENT:  Epilepsy with breakthrough seizures due to medication noncompliance.  DISCUSSION:  At this time, Ms. Dittman presents with no history of epilepsy disease, well controlled with phenobarbital; however, she has recently had breakthrough seizures that coincides with a subtherapeutic phenobarbital level.  She apparently has not had any phenobarbital in the past week, but apparently continues to consume alcohol.  Her neurologic examination at this time is nonfocal.  PLAN:  I agree with restarting phenobarbital, although this is probably not the best option with a history of alcohol abuse, it may be the best for financial compliance.  Additionally, I am not certain as to whether or not this could have been also an alcohol withdrawal seizure given her low alcohol level.  I agree with DT precautions.  She does not drive, so this should not be an issue.  Thank you very much for allowing me to participate in the care of this interesting and pleasant patient.         ______________________________ Beryle Beams, MD    RY/MEDQ  D:  10/29/2010  T:  10/30/2010  Job:  [id number]  Electronically Signed by Beryle Beams MD on 10/31/2010 07:33:25 AM

## 2010-11-01 LAB — URINE CULTURE: Special Requests: NEGATIVE

## 2010-11-01 LAB — BASIC METABOLIC PANEL
Calcium: 8.9 mg/dL (ref 8.4–10.5)
Chloride: 105 mEq/L (ref 96–112)
Creatinine, Ser: 0.58 mg/dL (ref 0.50–1.10)
GFR calc Af Amer: >90 mL/min (ref >90)

## 2010-11-01 NOTE — H&P (Signed)
Sandra Francis, Sandra Francis                  ACCOUNT NO.:  [id number]  MEDICAL RECORD NO.:  [id number]  LOCATION:  [facility]                         FACILITY:  [facility]  PHYSICIAN:  Kathlen Mody, MD       DATE OF BIRTH:  1960-02-16  DATE OF ADMISSION:  10/29/2010 DATE OF DISCHARGE:                             HISTORY & PHYSICAL   PRIMARY CARE PHYSICIAN:  MD at [facility].  CHIEF COMPLAINT:  Seizures this afternoon.  HISTORY OF PRESENT ILLNESS:  A 50 year old lady with history of epilepsy, depression, alcohol abuse, tobacco abuse,  was brought in by ambulance for generalized seizures this afternoon.  History was provided by the patient and by the patient's friend at the bedside.  As per the patient, the patient ran out of her phenobarbital about a week ago, and she has been having multiple seizures for 1 week, and today as per the patient's friend who was at the bedside, she had a blank stare, and she tried to get up, became stiff and fell on the back and she was trashing her extremities, she bit her tongue.  At that time, the patient's friend called 911 and brought her to [facility] ED.  The patient now reports postictal headache.  The patient also reports persistent nausea for about a week, and multiple episodes of vomiting yesterday.  The patient also is a heavy alcohol user and her last alcohol was this morning about a glass of wine.  She denies any chest pain, shortness of breath, fever, or chills.  Denies syncopal episodes other than the 1 that she had today.  She denies any blurry vision.  Her headache is mostly frontal headache associated with slight nausea.  REVIEW OF SYSTEMS:  See HPI, otherwise negative.  PAST MEDICAL HISTORY: 1. Epilepsy. 2. Depression. 3. Substance abuse. 4. Alcohol abuse. 5. Tobacco abuse.  PAST SURGICAL HISTORY: 1. Hysterectomy. 2. Back surgery. 3. Ankle surgery. 4. Left leg surgery.  ALLERGIES:  No known drug allergies.  FAMILY HISTORY:  Coronary  artery disease.  HOME MEDICATIONS: 1. Phenobarbital 100 mg b.i.d. 2. Gabapentin. 3. Prozac.  SOCIAL HISTORY:  The patient lives with her friend, continues to smoke cigarettes.  Takes alcohol daily.  Denies any recreational drug use, is unemployed.  PHYSICAL EXAMINATION:  VITAL SIGNS: The patient is afebrile, blood pressure 160/100, pulse ranging between 110 to 120, respiratory rate of 22, saturating about 96% on 2 L of oxygen. GENERAL: She is alert, afebrile, in mild distress and is having minor tremors in all 4 extremities and head. HEENT EXAM: Pupils reacting to light.  No JVD.  No scleral icterus.  Dry mucous membranes.  She has a bruise on the right side of her chin. CARDIOVASCULAR: S1-S2 heard, tachycardic.  No murmurs, rubs, or gallops. RESPIRATORY EXAM:  Chest clear to auscultation bilaterally. ABDOMEN:  Soft, nontender, nondistended.  Bowel sounds are heard. EXTREMITIES: No pedal edema.  PERTINENT LABORATORY DATA:  The patient had a CBC significant for MCHC of 36.3.  Point-of-care troponins negative.  Comprehensive metabolic panel significant for a sodium of 134, potassium of 3.4, glucose of 126. LFTs within normal limits.  Alcohol level less than 11.  RADIOLOGY:  The patient had a CT head without contrast, that is still pending.  ASSESSMENT AND PLAN:  This is a 50 year old lady with significant history of epilepsy, who reports being out of her medications for over a week, reports multiple episodes of seizure activity and the seizure activity that happened today appears to be a generalized tonoclonic seizure.  First, we will load the patient with IV phenobarbital 10 mg and start her on p.o. 100 mg b.i.d., IV Ativan 1-2 mg q.2 hours for seizures as needed.  Next, we will call neurology consult in a.m. for further recommendations.  Order an EEG.  We will also order a urine drug screen since she has a history of substance abuse.  Alcohol abuse.  The patient currently is  tachycardic, tachypneic with elevated blood pressure.  We will initiate CIWA protocol and put her on thiamine and folic acid.  Depression.  The patient currently states she is not depressed, no suicidal ideation.  Continue with her Prozac.  Persistent nausea and vomiting/dehydration.  We will hydrate the patient with IV fluids normal saline 100 mL/h.  Give her IV Zofran as needed for nausea and symptomatically treat her.  Tachycardia, tachypnea, elevated blood pressure.  The patient is most likely in alcohol withdrawal.  She was started on CIWA protocol and further recommendations as per her clinical course.  For DVT prophylaxis, SCDs.  The patient is full code.          ______________________________ Kathlen Mody, MD     VA/MEDQ  D:  10/29/2010  T:  10/29/2010  Job:  [id number]  Electronically Signed by Kathlen Mody MD on 11/01/2010 03:05:32 PM

## 2010-11-08 NOTE — Discharge Summary (Signed)
NAMEGENOVA, KINER                  ACCOUNT NO.:  [id number]  MEDICAL RECORD NO.:  [id number]  LOCATION:  1508                         FACILITY:  [facility]  PHYSICIAN:  Clydia Llano, MD       DATE OF BIRTH:  February 27, 1960  DATE OF ADMISSION:  10/29/2010 DATE OF DISCHARGE:  11/01/2010                              DISCHARGE SUMMARY   PRIMARY CARE PHYSICIAN:  Patient is set up to see [facility], Dr. Andrey Campanile.  REASON FOR ADMISSION:  Seizure.  DISCHARGE DIAGNOSES: 1. Seizure, now controlled. 2. Depression. 3. Substance abuse. 4. Alcohol abuse. 5. Tobacco abuse. 6. Facial trauma secondary to a fall from seizure. 7. Escherichia coli urinary tract infection.  DISCHARGE MEDICATIONS: 1. Ciprofloxacin 500 mg p.o. b.i.d. for 3 more days. 2. Oxycodone 5/325 mg 1 to 2 tablets every 8 hours for pain, only 20     pills given in prescription, no refills. 3. Gabapentin 300 mg 1 capsule 3 times a day. 4. Klonopin 1 mg b.i.d. as needed for seizure or anxiety only 10 pills     given in prescription. 5. Multivitamin with minerals 1 tablet daily. 6. Phenobarbital 100 mg b.i.d. 7. Prozac 20 mg 2 capsules every morning.  BRIEF HISTORY AND EXAMINATION:  Mrs. Sandra Francis is a 50 year old lady with epilepsy, depression, alcohol abuse, brought to the Emergency Department after generalized tonic-clonic seizure.  Patient at the time of admission had a friend at bedside.  As per him, patient ran out of her phenobarbital about a week ago and she has been having multiple seizures for 1 week.  She said she had blank stare and then she tried to get up, became still, fell back, and she had shaking in her extremities.  She bit her tongue and she fell on her face.  Patient reported no postictal headache at the time of admission, this is when it initially started.  RADIOLOGY: 1. CT head without contrast showed no acute intracranial     abnormalities. 2. CT head without contrast at the time of admission on October 20,  no     significant abnormalities identified.  BRIEF HOSPITAL COURSE: 1. Tonic-clonic seizure on background of epilepsy.  Patient said to     have epilepsy.  Patient has tonic-clonic seizure and admitted to     the hospital, started back with the phenobarbital, treated with     Ativan aggressively.  Patient did not have any seizure-like     activity since been in the hospital.  At the time of discharge,     patient was put back on her phenobarbital as well as her Klonopin     as needed for any shakes or seizures. 2. E. coli UTI, which is pansensitive.  Patient was put on Cipro for a     total of 5 days.  Already got 2 days in the hospital here, and she     will have 3 days after discharge. 3. Hypokalemia.  This is replaced aggressively. 4. Polysubstance abuse.  Her UDS showed THC and barbiturates. Patient     was counseled about that. 5. Alcohol abuse.  Patient said she drinks about 4 to 6 cans of 42  ounce of beer daily.  What patient states is that she is trying to     self medicate for seizures.  Patient and her friend are describing     the beer as alcohol withdrawal.  He said they have to drink in the     morning otherwise will start to shake every day.  But while patient     is in the hospital here, this initially was thought to be a big     problem because they mentioned that the very next day, they start     to shake.  Patient was watched over 3 days in the hospital here,     did not have any symptoms related to alcohol withdrawal.  Her blood     pressure okay.  No tachycardia and she is not shaking.  Patient is     walking around in the hallways with the nurses very safely without     any assistance.  Patient was felt safe to be discharged home. 6. Social issues.  Patient suffers from homelessness.  She lives in a     tent.  Social worker was involved and resources in [place]     were given.  Patient elected to go back to her tent with her     friend.  Although I  talked to them about cold weather, and of     course the coming winter.  Patient also was set up with     [facility].  Medication will be dispensed from the hospital until     the patient sees [facility] and get the medication for free over     there.     Clydia Llano, MD     ME/MEDQ  D:  11/01/2010  T:  11/02/2010  Job:  [id number]  cc:   [facility]  Electronically Signed by Clydia Llano  on 11/08/2010 11:07:47 AM

## 2010-11-13 ENCOUNTER — Emergency Department (HOSPITAL_COMMUNITY)
Admission: EM | Admit: 2010-11-13 | Discharge: 2010-11-14 | Disposition: A | Discharge status: Home / Self Care | Payer: Self-pay | Attending: Emergency Medicine | Admitting: Emergency Medicine

## 2010-11-13 ENCOUNTER — Encounter (HOSPITAL_COMMUNITY): Payer: Self-pay | Admitting: Emergency Medicine

## 2010-11-13 DIAGNOSIS — M7989 Other specified soft tissue disorders: Secondary | ICD-10-CM | POA: Insufficient documentation

## 2010-11-13 DIAGNOSIS — Z79899 Other long term (current) drug therapy: Secondary | ICD-10-CM | POA: Insufficient documentation

## 2010-11-13 DIAGNOSIS — R296 Repeated falls: Secondary | ICD-10-CM | POA: Insufficient documentation

## 2010-11-13 DIAGNOSIS — S62502A Fracture of unspecified phalanx of left thumb, initial encounter for closed fracture: Secondary | ICD-10-CM

## 2010-11-13 DIAGNOSIS — S62609A Fracture of unspecified phalanx of unspecified finger, initial encounter for closed fracture: Secondary | ICD-10-CM | POA: Insufficient documentation

## 2010-11-13 DIAGNOSIS — G40909 Epilepsy, unspecified, not intractable, without status epilepticus: Secondary | ICD-10-CM | POA: Insufficient documentation

## 2010-11-13 HISTORY — DX: Herpesviral infection, unspecified: B00.9

## 2010-11-13 HISTORY — DX: Nonrheumatic mitral (valve) prolapse: I34.1

## 2010-11-13 HISTORY — DX: Epilepsy, unspecified, not intractable, without status epilepticus: G40.909

## 2010-11-13 HISTORY — DX: Localization-related (focal) (partial) symptomatic epilepsy and epileptic syndromes with complex partial seizures, not intractable, without status epilepticus: G40.209

## 2010-11-13 HISTORY — DX: Unspecified osteoarthritis, unspecified site: M19.90
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
## 2010-11-14 ENCOUNTER — Encounter (HOSPITAL_COMMUNITY): Payer: Self-pay

## 2010-11-14 ENCOUNTER — Emergency Department (HOSPITAL_COMMUNITY): Payer: Self-pay

## 2010-11-14 MED ORDER — PHENOBARBITAL 97.2 MG PO TABS: 97.2000 mg | ORAL_TABLET | Freq: Two times a day (BID) | ORAL | Status: DC

## 2010-11-14 MED ORDER — OXYCODONE-ACETAMINOPHEN 5-325 MG PO TABS
2.0000 | ORAL_TABLET | Freq: Once | ORAL | Status: AC
  Administered 2010-11-14: 2 via ORAL

## 2010-11-14 MED ORDER — NAPROXEN 500 MG PO TABS: 500.0000 mg | ORAL_TABLET | Freq: Two times a day (BID) | ORAL | Status: DC

## 2010-11-14 MED ORDER — OXYCODONE-ACETAMINOPHEN 5-325 MG PO TABS
ORAL_TABLET | ORAL | Status: AC
  Administered 2010-11-14: 2 via ORAL
  Filled 2010-11-14: qty 2

## 2010-11-14 MED ORDER — VALACYCLOVIR HCL 1 G PO TABS: 1000.0000 mg | ORAL_TABLET | Freq: Three times a day (TID) | ORAL | Status: AC

## 2010-11-14 MED ORDER — PHENOBARBITAL 100 MG PO TABS
100.0000 mg | ORAL_TABLET | Freq: Once | ORAL | Status: AC
  Administered 2010-11-14: 97.2 mg via ORAL

## 2010-11-14 NOTE — ED Notes (Signed)
Pt here with left thumb pain.  Pt states she had seizure and hurt hand.  Pt also would like to be seen for herpes.

## 2010-11-14 NOTE — ED Provider Notes (Signed)
History     CSN: [id number] Arrival date & time: 11/13/2010  8:21 PM   None     Chief Complaint  Patient presents with  . Hand Pain    (Consider location/radiation/quality/duration/timing/severity/associated sxs/prior treatment) HPI Comments: 50 y/o female with hx of seizure d/o - has somewhat frequent seizures - had one yestrday - fell on her L hand and hurt the L thumb - ahs been in constant pain and can't bend 2/2 pain.  Denies numbness or change in color - also had tongue biting but no other c/o.  She is out of her seizure meds, and has ongoing herpes infection of the genitals.  She is out of this medicines also.  Sx are constant and mild, worse with palpation and moving the thumb.  Patient is a 50 y.o. female presenting with hand pain. The history is provided by the patient and medical records.  Hand Pain    Past Medical History  Diagnosis Date  . Seizure disorder, complex partial   . Herpes   . Prolapse of mitral valve     No past surgical history on file.  No family history on file.  History  Substance Use Topics  . Smoking status: Not on file  . Smokeless tobacco: Not on file  . Alcohol Use: Yes     pt smells of etoh     OB History    Grav Para Term Preterm Abortions TAB SAB Ect Mult Living                  Review of Systems  All other systems reviewed and are negative.    Allergies  Review of patient's allergies indicates no known allergies.  Home Medications   Current Outpatient Rx  Name Route Sig Dispense Refill  . ACYCLOVIR PO Oral Take by mouth.      . FLUOXETINE HCL 40 MG PO CAPS Oral Take 40 mg by mouth daily.      Marland Kitchen NAPROXEN 500 MG PO TABS Oral Take 1 tablet (500 mg total) by mouth 2 (two) times daily with a meal. 30 tablet 0  . PHENOBARBITAL 97.2 MG PO TABS Oral Take 97.2 mg by mouth 2 (two) times daily.      Marland Kitchen PHENOBARBITAL 97.2 MG PO TABS Oral Take 1 tablet (97.2 mg total) by mouth 2 (two) times daily. 60 tablet 1  . VALACYCLOVIR HCL 1  G PO TABS Oral Take 1 tablet (1,000 mg total) by mouth 3 (three) times daily. 30 tablet 1  . VALTREX PO Oral Take by mouth.        BP 124/88  Pulse 68  Temp(Src) 98.2 F (36.8 C) (Oral)  Resp 16  SpO2 100%  Physical Exam  Nursing note and vitals reviewed. Constitutional: She appears well-developed and well-nourished. No distress.  HENT:  Head: Normocephalic and atraumatic.  Mouth/Throat: Oropharynx is clear and moist. No oropharyngeal exudate.       topngue bite X 2 - superficial, no bleeding  Eyes: Conjunctivae and EOM are normal. Pupils are equal, round, and reactive to light. Right eye exhibits no discharge. Left eye exhibits no discharge. No scleral icterus.  Neck: Normal range of motion. Neck supple. No JVD present. No thyromegaly present.  Cardiovascular: Normal rate, regular rhythm, normal heart sounds and intact distal pulses.  Exam reveals no gallop and no friction rub.   No murmur heard. Pulmonary/Chest: Effort normal and breath sounds normal. No respiratory distress. She has no wheezes. She has no rales.  Abdominal: Bowel sounds are normal.  Musculoskeletal: She exhibits tenderness ( L thumb with ttp over the MCP and the IP, with dec ROM and mild swelling). She exhibits no edema.  Lymphadenopathy:    She has no cervical adenopathy.  Neurological: She is alert. Coordination normal.  Skin: Skin is warm and dry. No rash noted. No erythema.  Psychiatric: She has a normal mood and affect. Her behavior is normal.    ED Course  Procedures (including critical care time)  Labs Reviewed - No data to display Dg Finger Thumb Left  11/14/2010  *RADIOLOGY REPORT*  Clinical Data: Base of thumb pain.  LEFT THUMB 2+V  Comparison: None.  Findings: Linear lucency at the base of the distal phalanx first digit, only appreciated on the AP view. No other fracture identified.  Metacarpal phalangeal and interphalangeal joint DJD. No aggressive osseous lesion.  IMPRESSION: Linear lucency  through the base of the distal phalanx first digit. Only appreciated on the AP view.  May represent superimposed shadows however a nondisplaced fracture not excluded.  Correlate with point tenderness.  Original Report Authenticated By: Waneta Martins, M.D.     1. Closed fracture of left thumb       MDM  R/o frx, tx for seizure d/o - and refill meds, xray pencding   X-ray with slight fracture, medications given, splint applied, discharged home with orthopedic followup.     Vida Roller, MD 11/14/10 [phone]

## 2010-11-14 NOTE — ED Notes (Signed)
Pt given ice for thumb per order.

## 2010-11-14 NOTE — ED Notes (Signed)
Splint applied to L thumb per order

## 2010-12-11 ENCOUNTER — Emergency Department (HOSPITAL_COMMUNITY): Payer: Self-pay

## 2010-12-11 ENCOUNTER — Encounter (HOSPITAL_COMMUNITY): Payer: Self-pay | Admitting: *Deleted

## 2010-12-11 ENCOUNTER — Emergency Department (HOSPITAL_COMMUNITY)
Admission: EM | Admit: 2010-12-11 | Discharge: 2010-12-11 | Disposition: A | Discharge status: Home / Self Care | Payer: Self-pay | Attending: Emergency Medicine | Admitting: Emergency Medicine

## 2010-12-11 DIAGNOSIS — R5383 Other fatigue: Secondary | ICD-10-CM | POA: Insufficient documentation

## 2010-12-11 DIAGNOSIS — Z79899 Other long term (current) drug therapy: Secondary | ICD-10-CM | POA: Insufficient documentation

## 2010-12-11 DIAGNOSIS — M79609 Pain in unspecified limb: Secondary | ICD-10-CM | POA: Insufficient documentation

## 2010-12-11 DIAGNOSIS — R404 Transient alteration of awareness: Secondary | ICD-10-CM | POA: Insufficient documentation

## 2010-12-11 DIAGNOSIS — R5381 Other malaise: Secondary | ICD-10-CM | POA: Insufficient documentation

## 2010-12-11 DIAGNOSIS — M129 Arthropathy, unspecified: Secondary | ICD-10-CM | POA: Insufficient documentation

## 2010-12-11 DIAGNOSIS — S0990XA Unspecified injury of head, initial encounter: Secondary | ICD-10-CM | POA: Insufficient documentation

## 2010-12-11 DIAGNOSIS — G40909 Epilepsy, unspecified, not intractable, without status epilepticus: Secondary | ICD-10-CM | POA: Insufficient documentation

## 2010-12-11 DIAGNOSIS — R4789 Other speech disturbances: Secondary | ICD-10-CM | POA: Insufficient documentation

## 2010-12-11 LAB — POCT I-STAT, CHEM 8
Chloride: 102 mEq/L (ref 96–112)
Creatinine, Ser: 0.7 mg/dL (ref 0.50–1.10)
Glucose, Bld: 90 mg/dL (ref 70–99)
Potassium: 3.3 mEq/L — ABNORMAL LOW (ref 3.5–5.1)
Sodium: 139 mEq/L (ref 135–145)

## 2010-12-11 LAB — CBC
HCT: 36.8 % (ref 36.0–46.0)
Hemoglobin: 12.4 g/dL (ref 12.0–15.0)
RBC: 3.49 MIL/uL — ABNORMAL LOW (ref 3.87–5.11)
WBC: 10.8 10*3/uL — ABNORMAL HIGH (ref 4.0–10.5)

## 2010-12-11 LAB — DIFFERENTIAL
Lymphocytes Relative: 18 % (ref 12–46)
Lymphs Abs: 1.9 10*3/uL (ref 0.7–4.0)
Monocytes Absolute: 0.9 10*3/uL (ref 0.1–1.0)
Monocytes Relative: 8 % (ref 3–12)
Neutro Abs: 7.8 10*3/uL — ABNORMAL HIGH (ref 1.7–7.7)
Neutrophils Relative %: 72 % (ref 43–77)

## 2010-12-11 MED ORDER — FLUOXETINE HCL 20 MG PO TABS: 40.0000 mg | ORAL_TABLET | Freq: Every day | ORAL | Status: DC

## 2010-12-11 MED ORDER — CLONAZEPAM 0.5 MG PO TABS: 1.0000 mg | ORAL_TABLET | Freq: Three times a day (TID) | ORAL | Status: DC | PRN

## 2010-12-11 MED ORDER — FLUOXETINE HCL 20 MG PO TABS: ORAL_TABLET | ORAL | Status: DC

## 2010-12-11 MED ORDER — FLUOXETINE HCL 20 MG PO CAPS: 40.0000 mg | ORAL_CAPSULE | Freq: Every day | ORAL | Status: DC

## 2010-12-11 MED ORDER — SODIUM CHLORIDE 0.9 % IV SOLN
Freq: Once | INTRAVENOUS | Status: AC
  Administered 2010-12-11: 05:00:00 via INTRAVENOUS

## 2010-12-11 MED ORDER — OXYCODONE-ACETAMINOPHEN 5-325 MG PO TABS: 1.0000 | ORAL_TABLET | ORAL | Status: AC | PRN

## 2010-12-11 MED ORDER — PHENOBARBITAL 97.2 MG PO TABS: 97.2000 mg | ORAL_TABLET | Freq: Two times a day (BID) | ORAL | Status: DC

## 2010-12-11 MED ORDER — OXYCODONE-ACETAMINOPHEN 5-325 MG PO TABS
1.0000 | ORAL_TABLET | Freq: Once | ORAL | Status: AC
  Administered 2010-12-11: 1 via ORAL
  Filled 2010-12-11: qty 1

## 2010-12-11 NOTE — ED Notes (Signed)
French Ana with social work is on her way to talk with patient.  Diet coke provided.
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
## 2010-12-11 NOTE — ED Provider Notes (Signed)
Medical screening examination/treatment/procedure(s) were performed by non-physician practitioner and as supervising physician I was immediately available for consultation/collaboration.  Doug Sou, MD 12/11/10 [phone]

## 2010-12-11 NOTE — ED Notes (Signed)
Pt transferred to yellow. Waiting for social work consult. Pt alert and oriented. NAD

## 2010-12-11 NOTE — ED Notes (Signed)
Pt requesting pain medication. Dr. Ignacia Palma, notified. EDP reporting will review chart and evaluate for pain medicine. Social work consulted.

## 2010-12-11 NOTE — ED Notes (Signed)
Pt given another diet coke. 
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
## 2010-12-11 NOTE — ED Notes (Signed)
Patient wheeled out to taxi, voucher given to taxi driver. Patient requesting to go to [facility], taxi driver notified.
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
## 2010-12-16 ENCOUNTER — Emergency Department (INDEPENDENT_AMBULATORY_CARE_PROVIDER_SITE_OTHER)
Admission: EM | Admit: 2010-12-16 | Discharge: 2010-12-16 | Disposition: A | Discharge status: Home / Self Care | Payer: Self-pay | Source: Home / Self Care | Attending: Family Medicine | Admitting: Family Medicine

## 2010-12-16 ENCOUNTER — Encounter (HOSPITAL_COMMUNITY): Payer: Self-pay | Admitting: Cardiology

## 2010-12-16 DIAGNOSIS — Z760 Encounter for issue of repeat prescription: Secondary | ICD-10-CM

## 2010-12-16 MED ORDER — PHENOBARBITAL 97.2 MG PO TABS: 97.2000 mg | ORAL_TABLET | Freq: Two times a day (BID) | ORAL | Status: DC
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
## 2011-05-26 ENCOUNTER — Encounter (HOSPITAL_COMMUNITY): Payer: Self-pay

## 2011-05-26 ENCOUNTER — Emergency Department (HOSPITAL_COMMUNITY): Payer: Self-pay

## 2011-05-26 ENCOUNTER — Emergency Department (HOSPITAL_COMMUNITY)
Admission: EM | Admit: 2011-05-26 | Discharge: 2011-05-26 | Disposition: A | Discharge status: Home Health | Payer: Self-pay | Attending: Emergency Medicine | Admitting: Emergency Medicine

## 2011-05-26 DIAGNOSIS — Z4789 Encounter for other orthopedic aftercare: Secondary | ICD-10-CM | POA: Insufficient documentation

## 2011-05-26 DIAGNOSIS — S6390XA Sprain of unspecified part of unspecified wrist and hand, initial encounter: Secondary | ICD-10-CM | POA: Insufficient documentation

## 2011-05-26 DIAGNOSIS — Y998 Other external cause status: Secondary | ICD-10-CM | POA: Insufficient documentation

## 2011-05-26 DIAGNOSIS — M129 Arthropathy, unspecified: Secondary | ICD-10-CM | POA: Insufficient documentation

## 2011-05-26 DIAGNOSIS — G8929 Other chronic pain: Secondary | ICD-10-CM | POA: Insufficient documentation

## 2011-05-26 DIAGNOSIS — Y92009 Unspecified place in unspecified non-institutional (private) residence as the place of occurrence of the external cause: Secondary | ICD-10-CM | POA: Insufficient documentation

## 2011-05-26 DIAGNOSIS — S53402A Unspecified sprain of left elbow, initial encounter: Secondary | ICD-10-CM

## 2011-05-26 DIAGNOSIS — I059 Rheumatic mitral valve disease, unspecified: Secondary | ICD-10-CM | POA: Insufficient documentation

## 2011-05-26 DIAGNOSIS — F17200 Nicotine dependence, unspecified, uncomplicated: Secondary | ICD-10-CM | POA: Insufficient documentation

## 2011-05-26 DIAGNOSIS — M545 Low back pain, unspecified: Secondary | ICD-10-CM | POA: Insufficient documentation

## 2011-05-26 DIAGNOSIS — S63501A Unspecified sprain of right wrist, initial encounter: Secondary | ICD-10-CM

## 2011-05-26 DIAGNOSIS — G40802 Other epilepsy, not intractable, without status epilepticus: Secondary | ICD-10-CM | POA: Insufficient documentation

## 2011-05-26 DIAGNOSIS — B009 Herpesviral infection, unspecified: Secondary | ICD-10-CM | POA: Insufficient documentation

## 2011-05-26 DIAGNOSIS — Z79899 Other long term (current) drug therapy: Secondary | ICD-10-CM | POA: Insufficient documentation

## 2011-05-26 DIAGNOSIS — S63619A Unspecified sprain of unspecified finger, initial encounter: Secondary | ICD-10-CM

## 2011-05-26 DIAGNOSIS — S5001XA Contusion of right elbow, initial encounter: Secondary | ICD-10-CM

## 2011-05-26 DIAGNOSIS — S63509A Unspecified sprain of unspecified wrist, initial encounter: Secondary | ICD-10-CM | POA: Insufficient documentation

## 2011-05-26 DIAGNOSIS — IMO0002 Reserved for concepts with insufficient information to code with codable children: Secondary | ICD-10-CM | POA: Insufficient documentation

## 2011-05-26 DIAGNOSIS — R51 Headache: Secondary | ICD-10-CM | POA: Insufficient documentation

## 2011-05-26 MED ORDER — ACETAMINOPHEN 325 MG PO TABS
650.0000 mg | ORAL_TABLET | Freq: Once | ORAL | Status: AC
  Administered 2011-05-26: 650 mg via ORAL
  Filled 2011-05-26: qty 2

## 2011-05-26 MED ORDER — HYDROCODONE-ACETAMINOPHEN 5-325 MG PO TABS: 1.0000 | ORAL_TABLET | ORAL | Status: DC | PRN

## 2011-05-26 MED ORDER — HYDROCODONE-ACETAMINOPHEN 5-325 MG PO TABS
1.0000 | ORAL_TABLET | Freq: Once | ORAL | Status: AC
  Administered 2011-05-26: 1 via ORAL
  Filled 2011-05-26: qty 1
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
## 2011-05-26 NOTE — ED Notes (Signed)
[employer] at bedside 

## 2011-05-26 NOTE — Discharge Instructions (Signed)
Take vicodin as prescribed for severe pain.   Do not drive within four hours of taking this medication (may cause drowsiness or confusion).  Take ibuprofen w/ food up to three times a day as well.    Ice painful areas 2-3 times a day for 15-20 minutes and avoid activities that aggravate pain.   Follow up with the orthopedic doctor for further evaluation and treatment of left knee pain.   You may return to the ER if symptoms worsen or you have any other concerns.

## 2011-05-26 NOTE — ED Notes (Signed)
Patient transported to X-ray 

## 2011-05-26 NOTE — Progress Notes (Signed)
Orthopedic Tech Progress Note Patient Details:  [name] [birth year] [id number]  Other Ortho Devices Type of Ortho Device: Knee Immobilizer;Buddy tape;Other (comment) Ortho Device Location: KI to left knee. Buddy tape bilateral 4th and 5th fingers. Shoulder sling to right arm. Ortho Device Interventions: Application   Jaimie Redditt T 05/26/2011, 11:48 AM

## 2011-05-26 NOTE — ED Notes (Signed)
Discharge instructions reviewed with pt; verbalizes understanding.  No questions asked; no further c/o's voiced.  Pt ambulatory to lobby.  NAD noted.  Sling intact to right shoulder.

## 2011-05-27 NOTE — ED Provider Notes (Signed)
Medical screening examination/treatment/procedure(s) were performed by non-physician practitioner and as supervising physician I was immediately available for consultation/collaboration.  Yacine Garriga, MD 05/27/11 0703 

## 2011-06-04 ENCOUNTER — Emergency Department (INDEPENDENT_AMBULATORY_CARE_PROVIDER_SITE_OTHER)
Admission: EM | Admit: 2011-06-04 | Discharge: 2011-06-04 | Disposition: A | Discharge status: Home Health | Payer: Self-pay | Source: Home / Self Care | Attending: Emergency Medicine | Admitting: Emergency Medicine

## 2011-06-04 ENCOUNTER — Encounter (HOSPITAL_COMMUNITY): Payer: Self-pay

## 2011-06-04 DIAGNOSIS — Z760 Encounter for issue of repeat prescription: Secondary | ICD-10-CM

## 2011-06-04 DIAGNOSIS — B002 Herpesviral gingivostomatitis and pharyngotonsillitis: Secondary | ICD-10-CM

## 2011-06-04 MED ORDER — DIPHENHYDRAMINE HCL 12.5 MG/5ML PO ELIX
25.0000 mg | ORAL_SOLUTION | Freq: Once | ORAL | Status: AC
  Administered 2011-06-04: 25 mg via ORAL

## 2011-06-04 MED ORDER — ALUM & MAG HYDROXIDE-SIMETH 200-200-20 MG/5ML PO SUSP: 5.0000 mL | Freq: Four times a day (QID) | ORAL | Status: AC | PRN

## 2011-06-04 MED ORDER — CLONAZEPAM 0.5 MG PO TABS: 0.5000 mg | ORAL_TABLET | Freq: Four times a day (QID) | ORAL | Status: DC

## 2011-06-04 MED ORDER — GI COCKTAIL ~~LOC~~
ORAL | Status: AC
  Filled 2011-06-04: qty 30

## 2011-06-04 MED ORDER — DIPHENHYDRAMINE HCL 12.5 MG/5ML PO LIQD: 12.5000 mg | Freq: Four times a day (QID) | ORAL | Status: DC | PRN

## 2011-06-04 MED ORDER — PHENOBARBITAL 97.2 MG PO TABS: 97.2000 mg | ORAL_TABLET | Freq: Two times a day (BID) | ORAL | Status: DC

## 2011-06-04 MED ORDER — ACYCLOVIR 800 MG PO TABS: 800.0000 mg | ORAL_TABLET | Freq: Two times a day (BID) | ORAL | Status: AC

## 2011-06-04 MED ORDER — LIDOCAINE VISCOUS 2 % MT SOLN: 20.0000 mL | Freq: Once | OROMUCOSAL | Status: DC

## 2011-06-04 MED ORDER — GI COCKTAIL ~~LOC~~
30.0000 mL | Freq: Once | ORAL | Status: AC
  Administered 2011-06-04: 30 mL via ORAL

## 2011-06-04 MED ORDER — DIPHENHYDRAMINE HCL 12.5 MG/5ML PO ELIX
ORAL_SOLUTION | ORAL | Status: AC
  Filled 2011-06-04: qty 10
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
## 2011-06-04 NOTE — ED Notes (Signed)
Patient states she does not have any funds to get RX filled.  I spoke with Tymeeka from Care Management.  She stated would be able to fill her acyclovir and 3 days worth of her phenobarbital.  Patient expressed understanding.  New RX printed and taken to pharmacy.  Patient made aware o delays.  Patient concerned that [facility] will not let her in after 6.  I called [facility] and they stated intake would start at 6 but she had to be there by 10.  Patient made aware.  [facility] took patient information.  Patient did not have photo identification with her.  Copy of driver's license found in chart and printed for patient to take with her.
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
## 2011-06-04 NOTE — Discharge Instructions (Signed)
You will need to find a primary care physician. Go to www.goodrx.com to look up your medications. This will give you a list of where you can find your prescriptions at the most affordable prices.   Call [facility]  [phone]  If you have no primary doctor, here are some resources that may be helpful:  Medicaid-accepting [employer] Providers:   - [facility]- [address]      [phone]      Mon-Fri 9am-7pm, Sat 9am-1pm   - [facility]- [address] [place], [place]      [phone]   - [facility]- [address], [address] [place]      [phone]   [facility]- [address]      [phone]   - Renaye Rakers- [address] [place], [address]      [phone]      Only accepts [place] patients       after they have her name applied to their card   Self Pay (no insurance) in [place]:   - Sickle Cell Patients: Dr Willey Blade, [facility]      [address] [place]      [phone]   - [phone]   - Physician Referral Service- [phone]   - [facility]- [address] [place]      [phone]   [facility]- [address]   - [facility]- see information above      (Speak to [employer] if you do not have insurance)   - [facility]- [address] [place]      [phone]   - [facility] [place]- [address]      [phone]   - [facility]- [address]      [phone]   - Dr Julio Sicks-  [address], [place]      [phone]   - [facility]- [address]      [phone]   - [facility]- [address]      [phone]      Also [address]      [phone]   - [facility] [address]      [phone]      1st and 3rd Saturday every month, 10am-1pm    Other agencies that provide inexpensive medical  care:     [facility]  [phone]    [facility]  [phone]    [facility]  [phone]    [facility]  [phone] [address] [place] [address]    [facility]  [phone]    [facility]  [phone] [facility] [phone]   [address] [place], [place] [address]  Chronic Pain Problems Contact [facility]  [phone] Patients need to be referred by their primary care doctor.  [facility]  [facility]     [employer]                          [facility]. [address] [place]  [address]      [address] [place] [address]   [phone] (After Hours)  General Information: Finding a doctor when you do not have health insurance can be tricky. Although you are not limited by an insurance plan, you are of course limited by her finances and how much but he can pay out of pocket.  What are your options if you don't have health insurance?   1) Find a Librarian, academic and Pay Out of Pocket Although you won't have to find out who is covered by your insurance plan, it is a good idea to ask around and get recommendations. You will then need to call the office and see if the doctor you have chosen will accept you as a new patient and what types of options they offer for patients who are self-pay. Some doctors offer discounts or will set up payment plans for their patients who do not have insurance, but you will need to ask so you aren't surprised when you get to your appointment.  2) Contact Your Local Health Department Not all health departments have doctors that can see patients for sick visits, but many do, so it is worth a call to see if yours does. If you don't know where your local health department is, you can check in your phone book. The CDC also has a tool to help you locate your state's health  department, and many state websites also have listings of all of their local health departments.  3) Find a Walk-in Clinic If your illness is not likely to be very severe or complicated, you may want to try a walk in clinic. These are popping up all over the country in pharmacies, drugstores, and shopping centers. They're usually staffed by nurse practitioners or physician assistants that have been trained to treat common illnesses and complaints. They're usually fairly quick and inexpensive. However, if you have serious medical issues or chronic medical problems, these are probably not your best option

## 2011-06-04 NOTE — ED Notes (Signed)
Patient states she has placement at [facility] house in [facility] but does not have a way to get there.  Social Worker French Ana contacted @ [phone] and will bring [employer].

## 2011-06-04 NOTE — Progress Notes (Signed)
Cm contacted concerning medication assistance for pt. Pt eligible for medication assistance. 2 rx sent to main pharmacy. Pt homeless.CSW notified concerning shelter placement.    Leonie Green [phone]

## 2011-09-27 ENCOUNTER — Emergency Department (INDEPENDENT_AMBULATORY_CARE_PROVIDER_SITE_OTHER)
Admission: EM | Admit: 2011-09-27 | Discharge: 2011-09-27 | Disposition: A | Discharge status: Home / Self Care | Payer: Self-pay | Source: Home / Self Care | Attending: Emergency Medicine | Admitting: Emergency Medicine

## 2011-09-27 ENCOUNTER — Encounter (HOSPITAL_COMMUNITY): Payer: Self-pay

## 2011-09-27 DIAGNOSIS — B009 Herpesviral infection, unspecified: Secondary | ICD-10-CM

## 2011-09-27 DIAGNOSIS — R569 Unspecified convulsions: Secondary | ICD-10-CM

## 2011-09-27 MED ORDER — HYDROCODONE-ACETAMINOPHEN 5-325 MG PO TABS
ORAL_TABLET | ORAL | Status: AC
  Filled 2011-09-27: qty 1

## 2011-09-27 MED ORDER — HYDROCODONE-ACETAMINOPHEN 5-325 MG PO TABS
1.0000 | ORAL_TABLET | Freq: Once | ORAL | Status: AC
  Administered 2011-09-27: 1 via ORAL

## 2011-09-27 MED ORDER — CLONAZEPAM 1 MG PO TBDP: 1.0000 mg | ORAL_TABLET | Freq: Two times a day (BID) | ORAL | Status: DC | PRN

## 2011-09-27 MED ORDER — ACYCLOVIR 400 MG PO TABS: 400.0000 mg | ORAL_TABLET | Freq: Three times a day (TID) | ORAL | Status: DC

## 2011-09-27 MED ORDER — PHENOBARBITAL 97.2 MG PO TABS: 97.2000 mg | ORAL_TABLET | Freq: Two times a day (BID) | ORAL | Status: DC

## 2011-09-27 MED ORDER — ACETAMINOPHEN-CODEINE #3 300-30 MG PO TABS: 1.0000 | ORAL_TABLET | ORAL | Status: DC | PRN

## 2011-09-27 NOTE — ED Provider Notes (Signed)
Chief Complaint  Patient presents with  . Medication Refill    History of Present Illness:   Sandra Francis is a 51 year old female who presents today for refills on seizure medications and acyclovir for herpes suppression.  She has had seizures since 1987. She states these are idiopathic. They're tonic-clonic in nature. Currently she is on phenobarbital 97.2 mg twice daily and clonazepam 1 mg twice daily. She denies any medication side effects. Her last seizure was a month ago. She has not seen a neurologist in years. She denies any use of alcohol or recreational drugs. She does not drive. She denies any headache or neurological symptoms. She states that she had a phenobarbital level done 2 months ago and was normal.  She also has had a history of recurring oral and genital herpes since 1985. These occurred on the buccal mucosa and also on the buttocks. She's been on suppressive therapy with acyclovir 400 mg 3 times a day. She states that with this the lesions do not recur. She tolerates this medication well without any side effects. Currently she complains of severe pain on the right side of the mouth and the left side of the tongue.  Review of Systems:  Other than noted above, the patient denies any of the following symptoms. Systemic:  No fever, chills, sweats, fatigue, myalgias, headache, or anorexia. Eye:  No redness, pain or drainage. ENT:  No earache, nasal congestion, rhinorrhea, sinus pressure, or sore throat. Lungs:  No cough, sputum production, wheezing, shortness of breath.  Cardiovascular:  No chest pain, palpitations, or syncope. GI:  No nausea, vomiting, abdominal pain or diarrhea. GU:  No dysuria, frequency, or hematuria. Skin:  No rash or pruritis.  PMFSH:  Past medical history, family history, social history, meds, and allergies were reviewed.   Physical Exam:   Vital signs:  BP 154/100  Pulse 64  Temp 97 F (36.1 C) (Oral)  Resp 18  SpO2 100% General:  Alert, in no  distress. Eye:  PERRL, full EOMs.  Lids and conjunctivas were normal. ENT:  TMs and canals were normal, without erythema or inflammation.  Nasal mucosa was clear and uncongested, without drainage.  Mucous membranes were moist.  Pharynx was clear, without exudate or drainage.  There is a small ulceration on the left side of her tongue. I did not see anything on the right side except for some irritation of the buccal mucosa probably due to dental trauma. Neck:  Supple, no adenopathy, tenderness or mass. Thyroid was normal. Lungs:  No respiratory distress.  Lungs were clear to auscultation, without wheezes, rales or rhonchi.  Breath sounds were clear and equal bilaterally. Heart:  Regular rhythm, without gallops, murmers or rubs. Abdomen:  Soft, flat, and non-tender to palpation.  No hepatosplenomagaly or mass. Neurological exam: She has a tremor of her head.  Neurological examination: The patient is alert and oriented x3. Speech is clear, fluent, and appropriate. Cranial nerves are intact. There is no pronator drift and finger to nose was normal. Muscle strength, sensation, and DTRs are normal. Babinskis are downgoing. Station and gait were normal. Romberg sign is negative, patient is able to perform tandem gait well. Skin:  Clear, warm, and dry, without rash or lesions.  Course in Urgent Care Center:   She was given hydrocodone/APAP 5/325 as a single dose by mouth and tolerated this well without any immediate side effects.  Assessment:  The primary encounter diagnosis was Seizures. A diagnosis of Herpes simplex was also pertinent to this visit.  I am not 100% certain that the oral lesion she has right now are herpes lesions. They look more like abscess ulcers and I can see anything at all on the right side of the mouth where most of her pain is. She insists however that they get better when she takes acyclovir.  Plan:   1.  The following meds were prescribed:   New Prescriptions    ACETAMINOPHEN-CODEINE (TYLENOL #3) 300-30 MG PER TABLET    Take 1-2 tablets by mouth every 4 (four) hours as needed for pain.   ACYCLOVIR (ZOVIRAX) 400 MG TABLET    Take 1 tablet (400 mg total) by mouth 3 (three) times daily.   CLONAZEPAM (KLONOPIN) 1 MG DISINTEGRATING TABLET    Take 1 tablet (1 mg total) by mouth 2 (two) times daily as needed for anxiety.   PHENOBARBITAL (LUMINAL) 97.2 MG TABLET    Take 1 tablet (97.2 mg total) by mouth 2 (two) times daily.   2.  The patient was instructed in symptomatic care and handouts were given. 3.  The patient was told to return if becoming worse in any way, if no better in 3 or 4 days, and given some red flag symptoms that would indicate earlier return. I suggested that she seek a primary care physician for refilling all of her routine medications.    Reuben Likes, MD 09/27/11 [phone]

## 2011-09-27 NOTE — ED Notes (Signed)
Patient requesting refills of medications for herpes and seizures; NAD at present;states she is having flare up of herpes

## 2012-02-12 ENCOUNTER — Emergency Department (INDEPENDENT_AMBULATORY_CARE_PROVIDER_SITE_OTHER)
Admission: EM | Admit: 2012-02-12 | Discharge: 2012-02-12 | Disposition: A | Discharge status: Home / Self Care | Payer: Self-pay | Source: Home / Self Care

## 2012-02-12 ENCOUNTER — Encounter (HOSPITAL_COMMUNITY): Payer: Self-pay | Admitting: Emergency Medicine

## 2012-02-12 DIAGNOSIS — B009 Herpesviral infection, unspecified: Secondary | ICD-10-CM

## 2012-02-12 DIAGNOSIS — I10 Essential (primary) hypertension: Secondary | ICD-10-CM

## 2012-02-12 DIAGNOSIS — F17200 Nicotine dependence, unspecified, uncomplicated: Secondary | ICD-10-CM

## 2012-02-12 DIAGNOSIS — G40909 Epilepsy, unspecified, not intractable, without status epilepticus: Secondary | ICD-10-CM

## 2012-02-12 DIAGNOSIS — Z720 Tobacco use: Secondary | ICD-10-CM

## 2012-02-12 MED ORDER — ACYCLOVIR 400 MG PO TABS: 400.0000 mg | ORAL_TABLET | Freq: Three times a day (TID) | ORAL | Status: DC

## 2012-02-12 MED ORDER — CLONAZEPAM 0.5 MG PO TABS: 0.5000 mg | ORAL_TABLET | Freq: Four times a day (QID) | ORAL | Status: DC

## 2012-02-12 MED ORDER — PHENOBARBITAL 97.2 MG PO TABS: 97.2000 mg | ORAL_TABLET | Freq: Two times a day (BID) | ORAL | Status: DC

## 2012-02-12 MED ORDER — CLONIDINE HCL 0.2 MG PO TABS: 0.2000 mg | ORAL_TABLET | Freq: Two times a day (BID) | ORAL | Status: DC

## 2012-02-12 NOTE — ED Notes (Signed)
Hayden Rasmussen, NP has been notified of HBP

## 2012-02-12 NOTE — ED Provider Notes (Signed)
History     CSN: [id number]  Arrival date & time 02/12/12  1256   First MD Initiated Contact with Patient 02/12/12 1343      Chief Complaint  Patient presents with  . Herpes Zoster  . Medication Refill    (Consider location/radiation/quality/duration/timing/severity/associated sxs/prior treatment) HPI Comments: 52 year old female presents with a request to have her medications refilled. She has a history of hypertension, herpes simplex one and 2, seizure disorder, tobacco abuse, benzodiazepine and barbiturates dependence. Medications in which she is requesting include clonazepam, phenobarbital, acyclovir and Percocet. She states that for the past several years she has been unable to afford a primary care doctor. There are records of prior visits to the urgent care and ED. She has been out of some of her medicines for several days. Her last seizure was 2 months ago. Her chief complaint today is a resurgence of painful ulcers and vesicles on her tongue. These are the same in which she has presented previously and 4 she has had for several years.   Past Medical History  Diagnosis Date  . Seizure disorder, complex partial   . Herpes   . Prolapse of mitral valve   . Arthritis   . Epilepsy     Past Surgical History  Procedure Date  . Cesarean section   . Radical hysterectomy   . Femur fracture surgery   . Neck surgery     No family history on file.  History  Substance Use Topics  . Smoking status: Current Every Day Smoker -- 0.7 packs/day    Types: Cigarettes  . Smokeless tobacco: Not on file  . Alcohol Use: Yes     Comment: quit  drinking 3 weeks ago    OB History    Grav Para Term Preterm Abortions TAB SAB Ect Mult Living                  Review of Systems  Constitutional: Negative.   HENT: Positive for mouth sores. Negative for congestion, sore throat, facial swelling, rhinorrhea, postnasal drip and ear discharge.   Respiratory: Negative.   Cardiovascular:  Negative.   Gastrointestinal: Negative.   Genitourinary: Negative.     Allergies  Review of patient's allergies indicates no known allergies.  Home Medications   Current Outpatient Rx  Name  Route  Sig  Dispense  Refill  . ACYCLOVIR 400 MG PO TABS   Oral   Take 1 tablet (400 mg total) by mouth 3 (three) times daily.   90 tablet   2   . CLONAZEPAM 0.5 MG PO TABS   Oral   Take 1 tablet (0.5 mg total) by mouth 4 (four) times daily.   30 tablet   0   . CLONIDINE HCL 0.2 MG PO TABS   Oral   Take 0.2 mg by mouth 2 (two) times daily.         Marland Kitchen PHENOBARBITAL 97.2 MG PO TABS   Oral   Take 1 tablet (97.2 mg total) by mouth 2 (two) times daily.   6 tablet   0   . ACETAMINOPHEN 500 MG PO TABS   Oral   Take 500 mg by mouth every 6 (six) hours as needed. For pain         . ACETAMINOPHEN-CODEINE #3 300-30 MG PO TABS   Oral   Take 1-2 tablets by mouth every 4 (four) hours as needed for pain.   15 tablet   0   . ACYCLOVIR 400 MG  PO TABS   Oral   Take 1 tablet (400 mg total) by mouth 3 (three) times daily. X 10 days   90 tablet   1   . CLONAZEPAM 0.5 MG PO TABS   Oral   Take 1 tablet (0.5 mg total) by mouth 4 (four) times daily.   120 tablet   0   . CLONAZEPAM 1 MG PO TBDP   Oral   Take 1 tablet (1 mg total) by mouth 2 (two) times daily as needed for anxiety.   60 tablet   2   . CLONIDINE HCL 0.2 MG PO TABS   Oral   Take 1 tablet (0.2 mg total) by mouth 2 (two) times daily.   60 tablet   0   . DIPHENHYDRAMINE HCL 12.5 MG/5ML PO LIQD   Oral   Take 5 mLs (12.5 mg total) by mouth 4 (four) times daily as needed for allergies. Mix 5 mL with 5 mL of Maalox. Hold in mouth and swallow. Take the 10 mL q 4-6 hr prn pain   120 mL   0   . IBUPROFEN 200 MG PO TABS   Oral   Take 200-800 mg by mouth every 6 (six) hours as needed. For pain         . PHENOBARBITAL 97.2 MG PO TABS   Oral   Take 1 tablet (97.2 mg total) by mouth 2 (two) times daily.   60 tablet    2   . PHENOBARBITAL 97.2 MG PO TABS   Oral   Take 1 tablet (97.2 mg total) by mouth 2 (two) times daily.   60 tablet   0     BP 186/134  Pulse 98  Temp 97.8 F (36.6 C) (Oral)  Resp 18  SpO2 99%  Physical Exam  Nursing note and vitals reviewed. Constitutional: She is oriented to person, place, and time. She appears well-developed. No distress.       Appears older than stated age  HENT:  Nose: Nose normal.  Mouth/Throat: Oropharynx is clear and moist. No oropharyngeal exudate.       Oropharynx with several papulovesicular lesions to the lateral aspect of the tongue.  Eyes: Conjunctivae normal and EOM are normal.  Neck: Normal range of motion. Neck supple.  Cardiovascular: Normal rate, regular rhythm and normal heart sounds.   Pulmonary/Chest: Effort normal. No respiratory distress. She has no wheezes. She has no rales.  Musculoskeletal: She exhibits no edema.  Neurological: She is alert and oriented to person, place, and time. She exhibits normal muscle tone.  Skin: Skin is warm and dry.  Psychiatric: She has a normal mood and affect.    ED Course  Procedures (including critical care time)  Labs Reviewed - No data to display No results found.   1. Seizure disorder   2. HTN (hypertension)   3. Herpes simplex   4. Tobacco abuse       MDM  I will refill her Zovirax 400 mg 3 times a day for one month Clonazepam 0.5 mg 4 times a day for one month Clonidine 0.2 mg twice a day for one month And phenobarbital 97.2 mg twice a day. I will not refill her Percocet or any other narcotics. She is instructed to take medications as directed including her clonidine. Her blood pressure is elevated and he is to be treated. Prior to discharge she will be instructed to obtain an appointment with [facility].  Hayden Rasmussen, NP 02/12/12 1520

## 2012-02-12 NOTE — ED Notes (Addendum)
Pt is here for an outbreak of Herpes Zoster and needing refills on medication Just moved last Tuesday to [facility] Needing refill on phenobarbital, klonopin, acyclovir, percocet and clonidine Has been out of clonidine x3 weeks Has a HA due to the herpes flare up in the mouth Denies: CP, SOB, edema, blurry vision  She is alert w/no signs of acute distress.

## 2012-02-13 NOTE — ED Provider Notes (Signed)
Medical screening examination/treatment/procedure(s) were performed by resident physician or non-physician practitioner and as supervising physician I was immediately available for consultation/collaboration.   Evian Salguero DOUGLAS MD.    Enid Maultsby D Anneth Brunell, MD 02/13/12 1310 

## 2012-02-25 ENCOUNTER — Emergency Department (HOSPITAL_COMMUNITY)
Admission: EM | Admit: 2012-02-25 | Discharge: 2012-02-26 | Disposition: A | Discharge status: Home / Self Care | Payer: Self-pay | Attending: Emergency Medicine | Admitting: Emergency Medicine

## 2012-02-25 ENCOUNTER — Encounter (HOSPITAL_COMMUNITY): Payer: Self-pay | Admitting: *Deleted

## 2012-02-25 DIAGNOSIS — M129 Arthropathy, unspecified: Secondary | ICD-10-CM | POA: Insufficient documentation

## 2012-02-25 DIAGNOSIS — Z760 Encounter for issue of repeat prescription: Secondary | ICD-10-CM | POA: Insufficient documentation

## 2012-02-25 DIAGNOSIS — F17200 Nicotine dependence, unspecified, uncomplicated: Secondary | ICD-10-CM | POA: Insufficient documentation

## 2012-02-25 DIAGNOSIS — Z8619 Personal history of other infectious and parasitic diseases: Secondary | ICD-10-CM | POA: Insufficient documentation

## 2012-02-25 DIAGNOSIS — G40909 Epilepsy, unspecified, not intractable, without status epilepticus: Secondary | ICD-10-CM | POA: Insufficient documentation

## 2012-02-25 DIAGNOSIS — Z79899 Other long term (current) drug therapy: Secondary | ICD-10-CM | POA: Insufficient documentation

## 2012-02-25 DIAGNOSIS — Z8669 Personal history of other diseases of the nervous system and sense organs: Secondary | ICD-10-CM | POA: Insufficient documentation

## 2012-02-25 LAB — URINALYSIS, ROUTINE W REFLEX MICROSCOPIC
Bilirubin Urine: NEGATIVE
Glucose, UA: NEGATIVE mg/dL
Hgb urine dipstick: NEGATIVE
Ketones, ur: NEGATIVE mg/dL
Protein, ur: NEGATIVE mg/dL

## 2012-02-25 LAB — COMPREHENSIVE METABOLIC PANEL
ALT: 22 U/L (ref 0–35)
AST: 30 U/L (ref 0–37)
Albumin: 3.6 g/dL (ref 3.5–5.2)
CO2: 26 mEq/L (ref 19–32)
Calcium: 8.8 mg/dL (ref 8.4–10.5)
Creatinine, Ser: 0.51 mg/dL (ref 0.50–1.10)
Sodium: 136 mEq/L (ref 135–145)

## 2012-02-25 LAB — CBC WITH DIFFERENTIAL/PLATELET
Basophils Relative: 0 % (ref 0–1)
Eosinophils Absolute: 0.4 10*3/uL (ref 0.0–0.7)
Eosinophils Relative: 8 % — ABNORMAL HIGH (ref 0–5)
HCT: 41.3 % (ref 36.0–46.0)
Hemoglobin: 13.9 g/dL (ref 12.0–15.0)
Lymphs Abs: 2 10*3/uL (ref 0.7–4.0)
MCH: 34.2 pg — ABNORMAL HIGH (ref 26.0–34.0)
MCHC: 33.7 g/dL (ref 30.0–36.0)
MCV: 101.7 fL — ABNORMAL HIGH (ref 78.0–100.0)
Monocytes Absolute: 0.4 10*3/uL (ref 0.1–1.0)
Monocytes Relative: 7 % (ref 3–12)
RBC: 4.06 MIL/uL (ref 3.87–5.11)

## 2012-02-25 LAB — RAPID URINE DRUG SCREEN, HOSP PERFORMED
Amphetamines: NOT DETECTED
Barbiturates: POSITIVE — AB
Opiates: NOT DETECTED
Tetrahydrocannabinol: NOT DETECTED

## 2012-02-25 LAB — URINE MICROSCOPIC-ADD ON
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
## 2012-02-26 LAB — PHENOBARBITAL LEVEL: Phenobarbital: 45.5 ug/mL — ABNORMAL HIGH (ref 15.0–40.0)

## 2012-02-26 MED ORDER — KETOROLAC TROMETHAMINE 30 MG/ML IJ SOLN
30.0000 mg | Freq: Once | INTRAMUSCULAR | Status: AC
  Administered 2012-02-26: 30 mg via INTRAVENOUS
  Filled 2012-02-26: qty 1

## 2012-02-26 MED ORDER — PHENOBARBITAL 97.2 MG PO TABS: 97.2000 mg | ORAL_TABLET | Freq: Two times a day (BID) | ORAL | Status: DC

## 2012-02-28 NOTE — ED Provider Notes (Signed)
Medical screening examination/treatment/procedure(s) were performed by non-physician practitioner and as supervising physician I was immediately available for consultation/collaboration.  Emmaly Leech, MD 02/28/12 0731 

## 2012-04-04 IMAGING — CT CT HEAD W/O CM
1 of 2 series · 13 of 30 positions shown, 17 images · non-contrast
Comparison: 08/11/2010

CLINICAL DATA: Altered mental status.

CT HEAD WITHOUT CONTRAST
TECHNIQUE: Contiguous axial images were obtained from the base of
the skull through the vertex without contrast.

[Series 2: brain · axial · 0.47mm/px · z∈[+22,+144]mm · 13 of 28 slices shown, 17 images]
[im 2/28  brain]
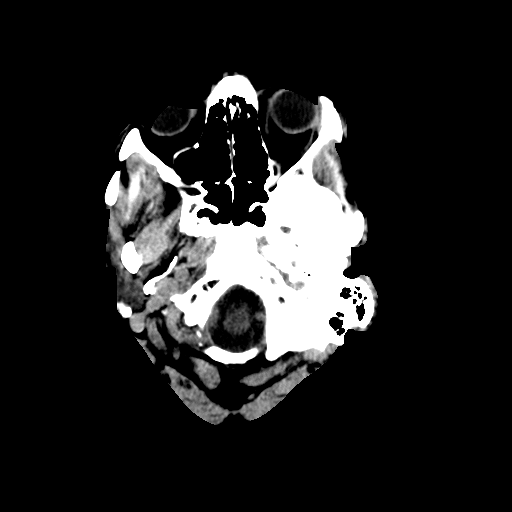
[im 2/28  bone]
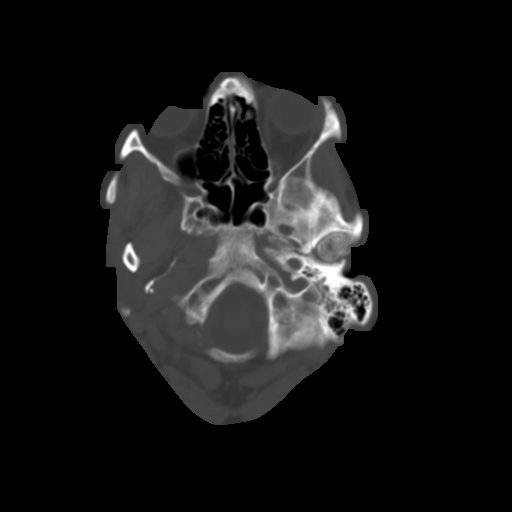
[im 4/28  brain]
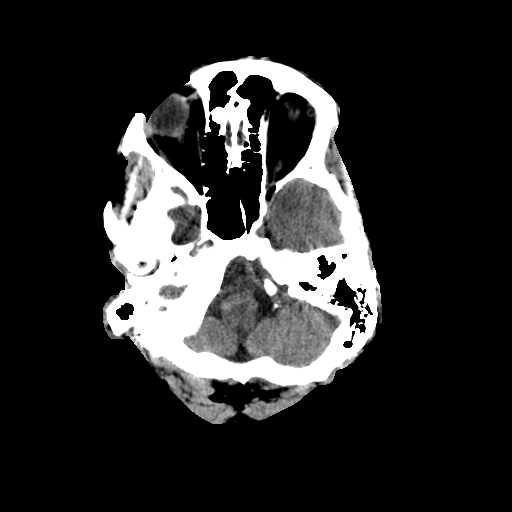
[im 6/28  brain]
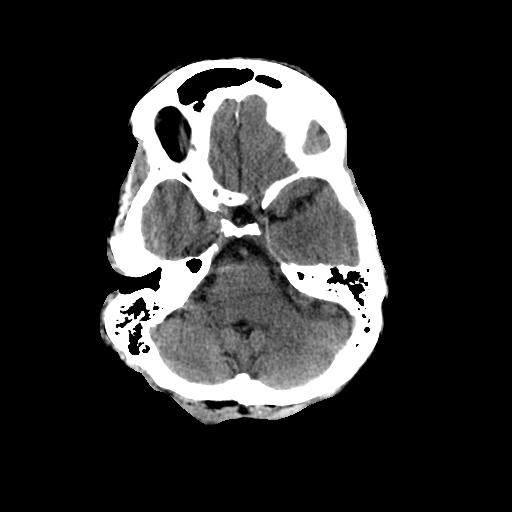
[im 8/28  brain]
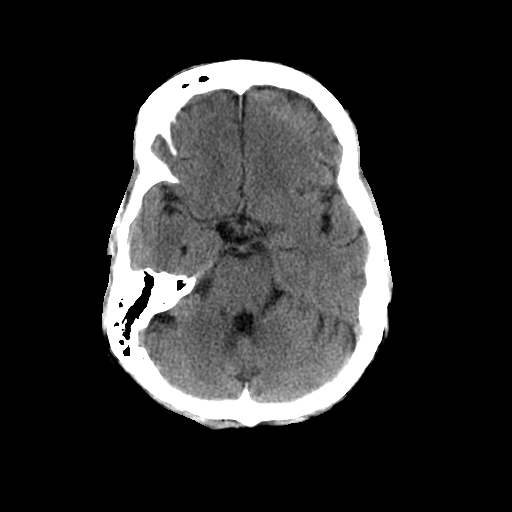
[im 10/28  brain]
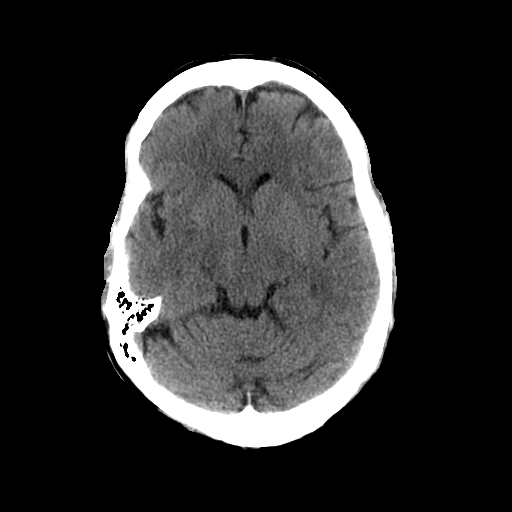
[im 10/28  bone]
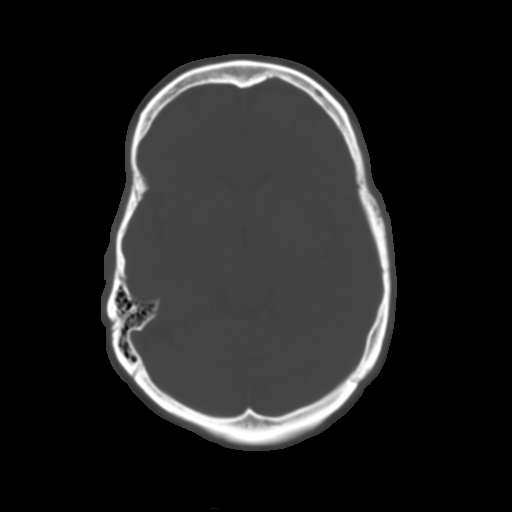
[im 12/28  brain]
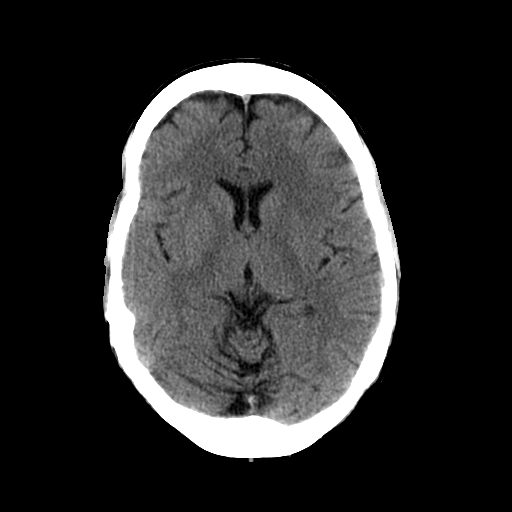
[im 14/28  brain]
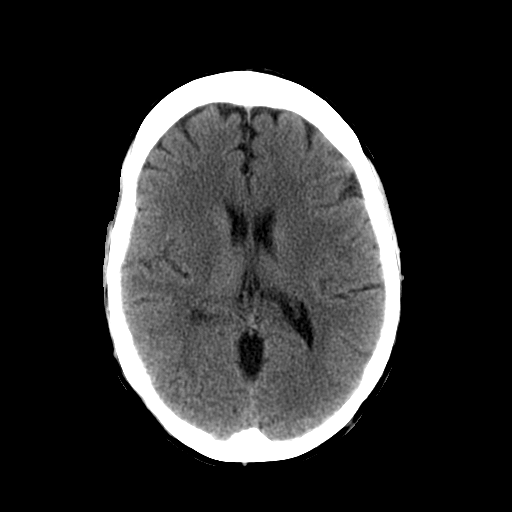
[im 16/28  brain]
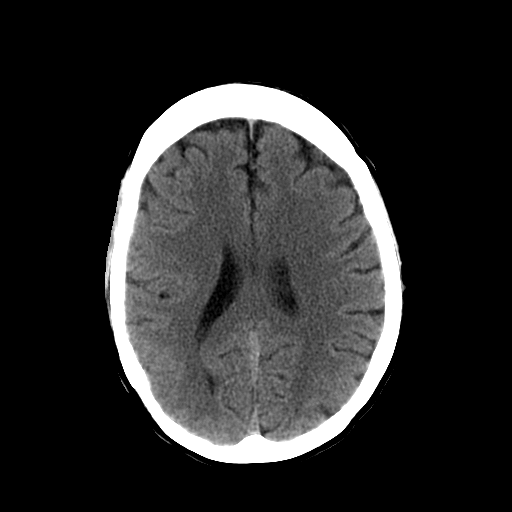
[im 18/28  brain]
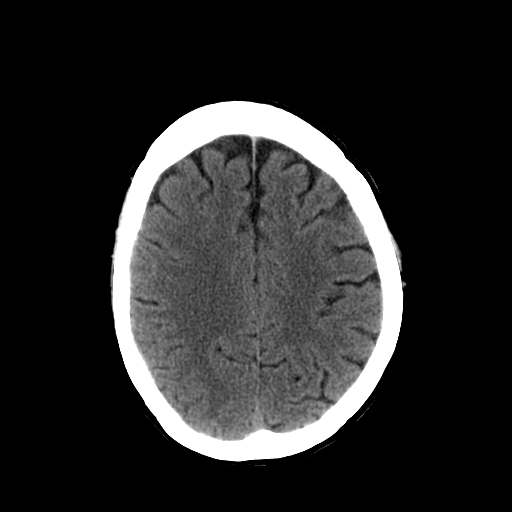
[im 18/28  bone]
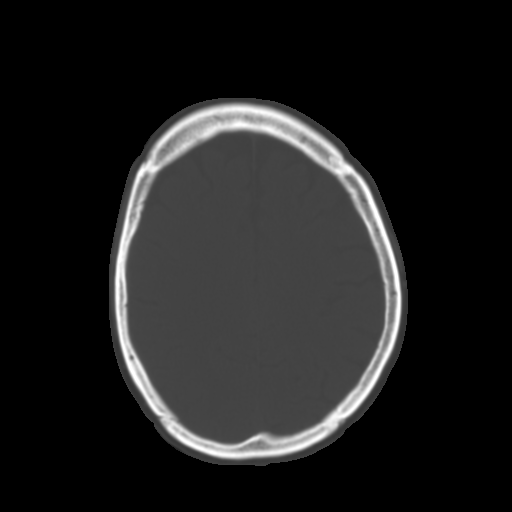
[im 20/28  brain]
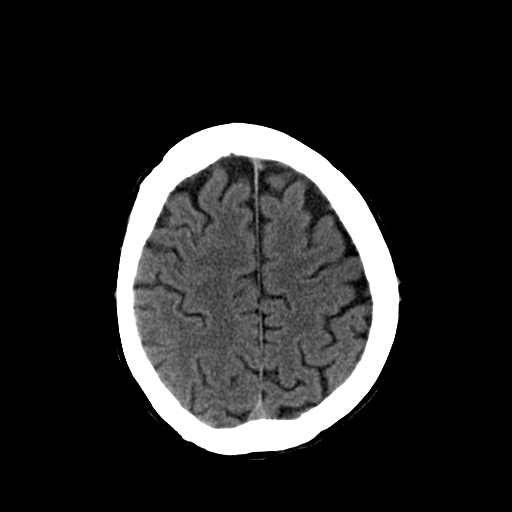
[im 22/28  brain]
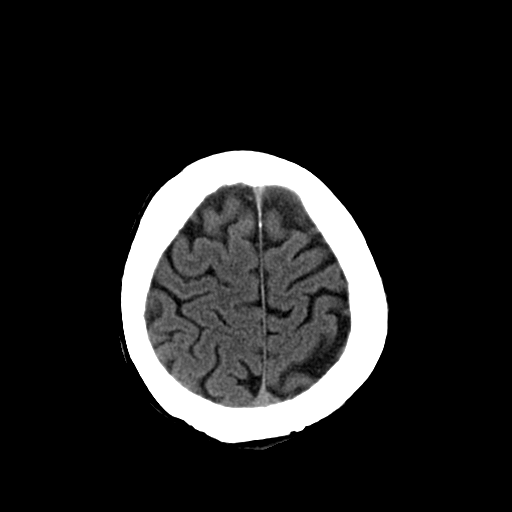
[im 24/28  brain]
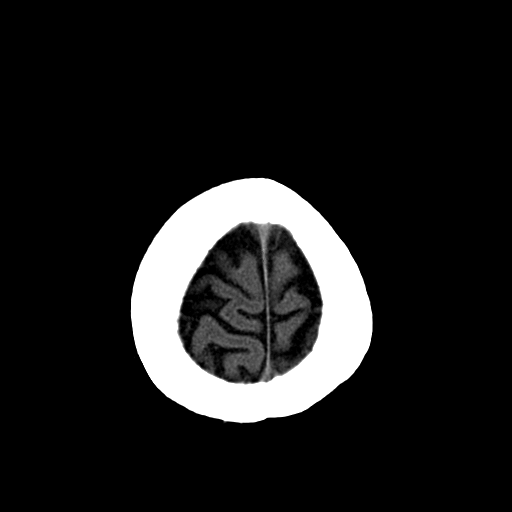
[im 26/28  brain]
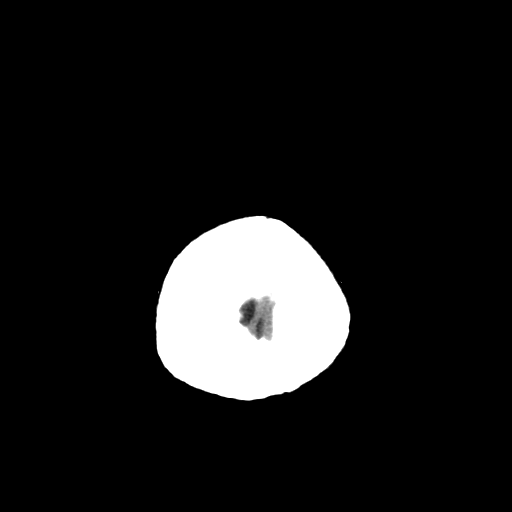
[im 26/28  bone]
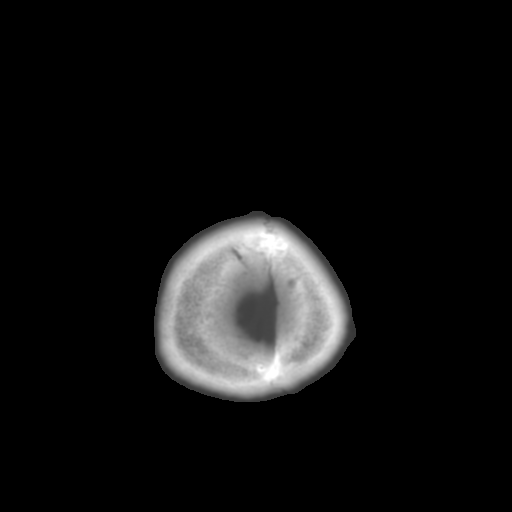

[13 of 30 positions shown; findings below may reference images not displayed]

FINDINGS: No acute intracranial abnormality.  Specifically, no
hemorrhage, hydrocephalus, mass lesion, acute infarction, or
significant intracranial injury.  No acute calvarial abnormality.
Visualized paranasal sinuses and mastoids clear.  Orbital soft
tissues unremarkable.
IMPRESSION: No acute intracranial abnormality.

## 2013-01-08 ENCOUNTER — Encounter (HOSPITAL_COMMUNITY): Payer: Self-pay | Admitting: Emergency Medicine

## 2013-01-08 ENCOUNTER — Emergency Department (HOSPITAL_COMMUNITY)
Admission: EM | Admit: 2013-01-08 | Discharge: 2013-01-08 | Disposition: A | Discharge status: Home / Self Care | Payer: Self-pay | Attending: Emergency Medicine | Admitting: Emergency Medicine

## 2013-01-08 DIAGNOSIS — Z79899 Other long term (current) drug therapy: Secondary | ICD-10-CM | POA: Insufficient documentation

## 2013-01-08 DIAGNOSIS — M549 Dorsalgia, unspecified: Secondary | ICD-10-CM | POA: Insufficient documentation

## 2013-01-08 DIAGNOSIS — F19939 Other psychoactive substance use, unspecified with withdrawal, unspecified: Secondary | ICD-10-CM | POA: Insufficient documentation

## 2013-01-08 DIAGNOSIS — Z8619 Personal history of other infectious and parasitic diseases: Secondary | ICD-10-CM | POA: Insufficient documentation

## 2013-01-08 DIAGNOSIS — M542 Cervicalgia: Secondary | ICD-10-CM | POA: Insufficient documentation

## 2013-01-08 DIAGNOSIS — Z8679 Personal history of other diseases of the circulatory system: Secondary | ICD-10-CM | POA: Insufficient documentation

## 2013-01-08 DIAGNOSIS — F13239 Sedative, hypnotic or anxiolytic dependence with withdrawal, unspecified: Secondary | ICD-10-CM

## 2013-01-08 DIAGNOSIS — G40909 Epilepsy, unspecified, not intractable, without status epilepticus: Secondary | ICD-10-CM | POA: Insufficient documentation

## 2013-01-08 DIAGNOSIS — M129 Arthropathy, unspecified: Secondary | ICD-10-CM | POA: Insufficient documentation

## 2013-01-08 DIAGNOSIS — F1123 Opioid dependence with withdrawal: Secondary | ICD-10-CM

## 2013-01-08 DIAGNOSIS — F1310 Sedative, hypnotic or anxiolytic abuse, uncomplicated: Secondary | ICD-10-CM | POA: Insufficient documentation

## 2013-01-08 DIAGNOSIS — Z760 Encounter for issue of repeat prescription: Secondary | ICD-10-CM | POA: Insufficient documentation

## 2013-01-08 DIAGNOSIS — F17200 Nicotine dependence, unspecified, uncomplicated: Secondary | ICD-10-CM | POA: Insufficient documentation

## 2013-01-08 DIAGNOSIS — F1110 Opioid abuse, uncomplicated: Secondary | ICD-10-CM | POA: Insufficient documentation

## 2013-01-08 MED ORDER — CLONAZEPAM 1 MG PO TBDP: 1.0000 mg | ORAL_TABLET | Freq: Two times a day (BID) | ORAL | Status: DC | PRN

## 2013-01-08 MED ORDER — PHENOBARBITAL 97.2 MG PO TABS: 97.2000 mg | ORAL_TABLET | Freq: Two times a day (BID) | ORAL | Status: DC

## 2013-01-08 MED ORDER — LORAZEPAM 1 MG PO TABS
1.0000 mg | ORAL_TABLET | Freq: Once | ORAL | Status: AC
  Administered 2013-01-08: 1 mg via ORAL
  Filled 2013-01-08: qty 1

## 2013-01-08 MED ORDER — OXYCODONE HCL 5 MG PO TABS: 5.0000 mg | ORAL_TABLET | Freq: Four times a day (QID) | ORAL | Status: DC | PRN

## 2013-01-08 NOTE — ED Notes (Signed)
States that she has had six episodes of diarrhea. States that she has not had her medications in 4 days.

## 2013-01-08 NOTE — ED Notes (Signed)
Pt presents needing refills of seizure and pain medications.  Pt sts "my doctor decided to not refill my medications.  I don't know why, except that my disability has not gone through."  Sts chronic pain in neck, back, and L leg pain.  Pain score 10/10.

## 2013-01-08 NOTE — ED Notes (Signed)
States that she has been seen that [facility] and is unable to get her medications refilled. States that she has been referred to Dr. Clarene Duke at [facility] and he will in the future refill her medications. Roxicodone 5mg  QID, Phenobarb 97.2mg  BID, Klonopin 1mg  BID. States that she has also been referred to a pain clinic. States that she is unable to get an appt with Dr. Clarene Duke until she fills out the new pt package.

## 2013-01-08 NOTE — ED Provider Notes (Signed)
Medical screening examination/treatment/procedure(s) were performed by non-physician practitioner and as supervising physician I was immediately available for consultation/collaboration.  EKG Interpretation   None         Junius Argyle, MD 01/08/13 [phone]

## 2013-01-08 NOTE — ED Provider Notes (Signed)
CSN: [id number]     Arrival date & time 01/08/13  1120 History  This chart was scribed for non-physician practitioner, Trixie Dredge, PA-C working with Junius Argyle, MD by Greggory Stallion, ED scribe. This patient was seen in room WTR7/WTR7 and the patient's care was started at 12:43 PM.   Chief Complaint  Patient presents with  . Medication Refill   The history is provided by the patient. No language interpreter was used.   HPI Comments: Sandra Francis is a 52 y.o. female who presents to the Emergency Department for medication refill. She states she needs her seizure and pain medications refilled. Pt has not had her medications for 3-4 days. States her doctor wouldn't refill them and is unsure why. She states it might be because her disability has not gone through so she hasn't paid her bill from that doctor's office. Pt has chronic pain in her neck, back and left leg. She has already contacted a new PCP but has not gotten an appointment yet.  Pt states she has had sweating episodes, muscle cramps and diarrhea. She takes Roxicodone 3-4 times per day and phenobarbital and klonopin BID. Denies fever, weakness or numbness in extremities.   Past Medical History  Diagnosis Date  . Seizure disorder, complex partial   . Herpes   . Prolapse of mitral valve   . Arthritis   . Epilepsy    Past Surgical History  Procedure Laterality Date  . Cesarean section    . Radical hysterectomy    . Femur fracture surgery    . Neck surgery     History reviewed. No pertinent family history. History  Substance Use Topics  . Smoking status: Current Every Day Smoker -- 0.75 packs/day    Types: Cigarettes  . Smokeless tobacco: Not on file  . Alcohol Use: Yes     Comment: quit  drinking 3 weeks ago   OB History   Grav Para Term Preterm Abortions TAB SAB Ect Mult Living                 Review of Systems  Constitutional: Positive for diaphoresis. Negative for fever.  Gastrointestinal: Positive for  vomiting.  Musculoskeletal: Positive for back pain, myalgias and neck pain.  Neurological: Negative for weakness and numbness.  All other systems reviewed and are negative.    Allergies  Review of patient's allergies indicates no known allergies.  Home Medications   Current Outpatient Rx  Name  Route  Sig  Dispense  Refill  . acetaminophen (TYLENOL) 500 MG tablet   Oral   Take 500 mg by mouth every 6 (six) hours as needed. For pain         . acetaminophen-codeine (TYLENOL #3) 300-30 MG per tablet   Oral   Take 1-2 tablets by mouth every 4 (four) hours as needed for pain.   15 tablet   0   . acyclovir (ZOVIRAX) 400 MG tablet   Oral   Take 400 mg by mouth 3 (three) times daily.         . clonazePAM (KLONOPIN) 1 MG disintegrating tablet   Oral   Take 1 tablet (1 mg total) by mouth 2 (two) times daily as needed for anxiety.   60 tablet   2   . cloNIDine (CATAPRES) 0.2 MG tablet   Oral   Take 0.2 mg by mouth 2 (two) times daily.         Marland Kitchen EXPIRED: diphenhydrAMINE (BENADRYL) 12.5 MG/5ML liquid  Oral   Take 5 mLs (12.5 mg total) by mouth 4 (four) times daily as needed for allergies. Mix 5 mL with 5 mL of Maalox. Hold in mouth and swallow. Take the 10 mL q 4-6 hr prn pain   120 mL   0   . ibuprofen (ADVIL,MOTRIN) 200 MG tablet   Oral   Take 200-800 mg by mouth every 6 (six) hours as needed. For pain         . PHENobarbital (LUMINAL) 97.2 MG tablet   Oral   Take 1 tablet (97.2 mg total) by mouth 2 (two) times daily.   60 tablet   2   . PHENobarbital (LUMINAL) 97.2 MG tablet   Oral   Take 1 tablet (97.2 mg total) by mouth 2 (two) times daily.   28 tablet   0    BP 150/114  Pulse 85  Temp(Src) 98.9 F (37.2 C) (Oral)  Resp 20  SpO2 98%  Physical Exam  Nursing note and vitals reviewed. Constitutional: She appears well-developed and well-nourished. No distress.  HENT:  Head: Normocephalic and atraumatic.  Neck: Neck supple.  Cardiovascular: Normal  rate and regular rhythm.   Pulmonary/Chest: Effort normal and breath sounds normal. No respiratory distress. She has no wheezes. She has no rales.  Abdominal: Soft. She exhibits no distension. There is no tenderness. There is no rebound and no guarding.  Neurological: She is alert.  Skin: She is not diaphoretic.    ED Course  Procedures (including critical care time)  DIAGNOSTIC STUDIES: Oxygen Saturation is 98% on RA, normal by my interpretation.    COORDINATION OF CARE: 12:47 PM-Discussed treatment plan which includes refilling medications with pt at bedside and pt agreed to plan.   Labs Review Labs Reviewed - No data to display Imaging Review No results found.  EKG Interpretation   None       MDM   1. Benzodiazepine withdrawal   2. Narcotic withdrawal   3. Medication refill    Patient who is on chronic benzodiazepines, phenobarbital, question if on chronic narcotics, presents to the emergency department with mild withdrawal symptoms requesting medication refills. She was checked by me on the DEA. database and was found to have regular refills of her phenobarbital and Klonopin last refilled early December and has not had her oxycodone filled since last August.  Given her withdrawal symptoms and anticipating that she will followup with Dr. Clarene Duke in climax [facility] as planned, I have refilled her Klonopin and phenobarbital. I have chosen not to refill her oxycodone despite the fact that she states she takes it several times a day because it has not been refilled her the [facility] database since August 2014.  Pt d/c home.  Discussed findings, treatment, and follow up  with patient.  Pt given return precautions.  Pt verbalizes understanding and agrees with plan.      I personally performed the services described in this documentation, which was scribed in my presence. The recorded information has been reviewed and is accurate.   Trixie Dredge, PA-C 01/08/13 1356

## 2013-01-08 NOTE — ED Notes (Signed)
Voiced understanding of instructions given 

## 2016-10-03 ENCOUNTER — Emergency Department (HOSPITAL_COMMUNITY)
Admission: EM | Admit: 2016-10-03 | Discharge: 2016-10-03 | Disposition: A | Discharge status: Home / Self Care | Payer: Medicaid Other | Attending: Emergency Medicine | Admitting: Emergency Medicine

## 2016-10-03 ENCOUNTER — Encounter (HOSPITAL_COMMUNITY): Payer: Self-pay | Admitting: Emergency Medicine

## 2016-10-03 DIAGNOSIS — F419 Anxiety disorder, unspecified: Secondary | ICD-10-CM | POA: Insufficient documentation

## 2016-10-03 DIAGNOSIS — F17210 Nicotine dependence, cigarettes, uncomplicated: Secondary | ICD-10-CM | POA: Insufficient documentation

## 2016-10-03 DIAGNOSIS — Z79899 Other long term (current) drug therapy: Secondary | ICD-10-CM | POA: Insufficient documentation

## 2016-10-03 DIAGNOSIS — Z720 Tobacco use: Secondary | ICD-10-CM

## 2016-10-03 DIAGNOSIS — B002 Herpesviral gingivostomatitis and pharyngotonsillitis: Secondary | ICD-10-CM

## 2016-10-03 DIAGNOSIS — K120 Recurrent oral aphthae: Secondary | ICD-10-CM | POA: Insufficient documentation

## 2016-10-03 MED ORDER — HYDROXYZINE HCL 50 MG PO TABS: 50.0000 mg | ORAL_TABLET | Freq: Four times a day (QID) | ORAL | 0 refills | Status: AC | PRN

## 2016-10-03 MED ORDER — HYDROXYZINE HCL 50 MG PO TABS: 50.0000 mg | ORAL_TABLET | Freq: Four times a day (QID) | ORAL | 0 refills | Status: DC | PRN

## 2016-10-03 MED ORDER — MAGIC MOUTHWASH W/LIDOCAINE
15.0000 mL | Freq: Once | ORAL | Status: AC
  Administered 2016-10-03: 15 mL via ORAL
  Filled 2016-10-03: qty 15

## 2016-10-03 MED ORDER — MAGIC MOUTHWASH W/LIDOCAINE: 15.0000 mL | Freq: Three times a day (TID) | ORAL | 0 refills | Status: DC | PRN

## 2016-10-03 MED ORDER — ACYCLOVIR 800 MG PO TABS: 800.0000 mg | ORAL_TABLET | Freq: Two times a day (BID) | ORAL | 0 refills | Status: AC

## 2016-10-03 MED ORDER — ACYCLOVIR 800 MG PO TABS
800.0000 mg | ORAL_TABLET | Freq: Two times a day (BID) | ORAL | 0 refills | Status: DC
Start: 2016-10-03 — End: 2016-10-03

## 2016-10-03 MED ORDER — MAGIC MOUTHWASH W/LIDOCAINE: 15.0000 mL | Freq: Three times a day (TID) | ORAL | 0 refills | Status: AC | PRN

## 2016-10-03 MED ORDER — HYDROXYZINE HCL 25 MG PO TABS
50.0000 mg | ORAL_TABLET | Freq: Once | ORAL | Status: AC
  Administered 2016-10-03: 50 mg via ORAL
  Filled 2016-10-03: qty 2

## 2016-10-03 NOTE — ED Triage Notes (Signed)
Patient doctor took her off her Xanax 4-5 months ago and she was supposed to follow up with psychiatrist but hasnt. Patient having lots of stress and her herpes oral outbreaks are getting bad and painful even taking her Valtrex.  Patient denies SI or HI but has lots of anxiety and very tearful in triage.

## 2016-10-03 NOTE — ED Provider Notes (Signed)
[facility]-EMERGENCY DEPT Provider Note   CSN: [id number] Arrival date & time: 10/03/16  1129     History   Chief Complaint Chief Complaint  Patient presents with  . Anxiety  . Herpes Zoster    HPI Sandra Francis is a 56 y.o. female with a PMHx of epilepsy, herpes, and mitral valve prolapse, who presents to the ED with  2 separate complaints. Her primary complaint is anxiety and increased stress in her life recently, requesting a refill for her Xanax.  She states that she was previously on xanax  "bars" for anxiety, however her PCP Dr. Kathrynn Speed at [facility] stopped prescribing it to her about 3-38mos ago stating that she needed to f/up with "someone else" in order to continue having that medication filled. She has not yet followed up with anyone else, and states that the stress in her life has increased, causing her anxiety to become more of an issue so she's asking for [place] to refill her xanax today. She admits that she's used her friend's xanax a few times in order to help with her symptoms, but denies taking anything else. Symptoms aggravated by stress. She denies SI/HI/AVH and EtOH use. She brings in paperwork from 02/2014, stating that "this is where they started the xanax", however it appears she was seen for a URI that visit; on the medication list from the paperwork, xanax  tablets are listed on her chronic medications. However it was not prescribed to her that day/visit. +Smoker.  Secondarily, she also c/o oral ulcers in her mouth that have been ongoing for a while, states she takes acyclovir  QD for herpes, but states she's having an outbreak despite taking this medication. She was previously rx'd acyclovir  5x/day x10 days for when she has outbreaks, and is requesting this. No known aggravating or alleviating factors.   She denies fevers, chills, drooling, trismus, CP, SOB, abd pain, N/V/D/C, hematuria, dysuria, myalgias, arthralgias, numbness, tingling, focal  weakness, SI, HI, AVH, or any other complaints at this time.    The history is provided by the patient and medical records. No language interpreter was used.  Anxiety  This is a chronic problem. The current episode started more than 1 week ago. The problem occurs constantly. The problem has not changed since onset.Pertinent negatives include no chest pain, no abdominal pain and no shortness of breath. The symptoms are aggravated by stress. Nothing relieves the symptoms. She has tried nothing for the symptoms. The treatment provided no relief.    Past Medical History:  Diagnosis Date  . Arthritis   . Epilepsy (HCC)   . Herpes   . Prolapse of mitral valve   . Seizure disorder, complex partial (HCC)     There are no active problems to display for this patient.   Past Surgical History:  Procedure Laterality Date  . CESAREAN SECTION    . FEMUR FRACTURE SURGERY    . NECK SURGERY    . RADICAL HYSTERECTOMY      OB History    No data available       Home Medications    Prior to Admission medications   Medication Sig Start Date End Date Taking? Authorizing Provider  clonazePAM (KLONOPIN) 1 MG disintegrating tablet Take 1 tablet (1 mg total) by mouth 2 (two) times daily as needed. 01/08/13   Trixie Dredge, PA-C  clonazePAM (KLONOPIN) 1 MG tablet Take 1 mg by mouth 2 (two) times daily.    [provider]  diphenhydrAMINE (  BENADRYL) 12.5 MG/5ML liquid Take 5 mLs (12.5 mg total) by mouth 4 (four) times daily as needed for allergies. Mix 5 mL with 5 mL of Maalox. Hold in mouth and swallow. Take the 10 mL q 4-6 hr prn pain 06/04/11 06/14/11  Domenick Gong, MD  oxyCODONE (OXY IR/ROXICODONE) 5 MG immediate release tablet Take 5 mg by mouth every 4 (four) hours as needed for moderate pain or severe pain.    [provider]  PHENobarbital (LUMINAL) 97.2 MG tablet Take 1 tablet (97.2 mg total) by mouth 2 (two) times daily. 02/26/12   Azalia Bilis, MD  PHENobarbital (LUMINAL)  97.2 MG tablet Take 1 tablet (97.2 mg total) by mouth 2 (two) times daily. 01/08/13   Trixie Dredge, PA-C    Family History No family history on file.  Social History Social History  Substance Use Topics  . Smoking status: Current Every Day Smoker    Packs/day: 0.75    Types: Cigarettes  . Smokeless tobacco: Never Used  . Alcohol use Yes     Comment: quit  drinking 3 weeks ago     Allergies   Patient has no known allergies.   Review of Systems Review of Systems  Constitutional: Negative for chills and fever.  HENT: Negative for drooling and trouble swallowing.        +herpes outbreak in mouth  Respiratory: Negative for shortness of breath.   Cardiovascular: Negative for chest pain.  Gastrointestinal: Negative for abdominal pain, constipation, diarrhea, nausea and vomiting.  Genitourinary: Negative for dysuria and hematuria.  Musculoskeletal: Negative for arthralgias and myalgias.  Skin: Negative for color change.  Allergic/Immunologic: Negative for immunocompromised state.  Neurological: Negative for weakness and numbness.  Psychiatric/Behavioral: Negative for confusion, hallucinations and suicidal ideas. The patient is nervous/anxious.    All other systems reviewed and are negative for acute change except as noted in the HPI.    Physical Exam Updated Vital Signs BP 112/81 (BP Location: Left Arm)   Pulse 69   Temp 98.2 F (36.8 C) (Oral)   Resp 16   Ht  (1.651 m)   Wt 55.3 kg (122 lb)   SpO2 100%   BMI 20.30 kg/m   Physical Exam  Constitutional: She is oriented to person, place, and time. Vital signs are normal. She appears well-developed and well-nourished.  Non-toxic appearance. No distress.  Afebrile, nontoxic, NAD, tearful at times but then laughing later on in conversation  HENT:  Head: Normocephalic and atraumatic.  Mouth/Throat: Uvula is midline, oropharynx is clear and moist and mucous membranes are normal. Oral lesions present. No trismus in the  jaw. No uvula swelling. Tonsils are 0 on the right. Tonsils are 0 on the left. No tonsillar exudate.  Two small aphthous ulcers, one to upper inner lip, and one to left lateral tongue; both mildly erythematous and TTP, no drainage or significant swelling. No vesicles noted to mouth or face. Remainder of oropharynx clear and moist, no uvular swelling or deviation, no trismus or drooling, no tonsillar swelling or erythema, no exudates.    Eyes: Conjunctivae and EOM are normal. Right eye exhibits no discharge. Left eye exhibits no discharge.  Neck: Normal range of motion. Neck supple.  Cardiovascular: Normal rate, regular rhythm, normal heart sounds and intact distal pulses.  Exam reveals no gallop and no friction rub.   No murmur heard. Pulmonary/Chest: Effort normal and breath sounds normal. No respiratory distress. She has no decreased breath sounds. She has no wheezes. She has no  rhonchi. She has no rales.  Abdominal: Soft. Normal appearance and bowel sounds are normal. She exhibits no distension. There is no tenderness. There is no rigidity, no rebound, no guarding, no CVA tenderness, no tenderness at McBurney's point and negative Murphy's sign.  Musculoskeletal: Normal range of motion.  Neurological: She is alert and oriented to person, place, and time. She has normal strength. No sensory deficit.  Skin: Skin is warm, dry and intact. No rash noted.  Psychiatric: Her mood appears anxious. She is not actively hallucinating. She expresses no homicidal and no suicidal ideation. She expresses no suicidal plans and no homicidal plans.  Anxious appearing, tearful at times during interview. Denies SI, HI, or AVH, doesn't seem to be responding to internal stimuli.   Nursing note and vitals reviewed.    ED Treatments / Results  Labs (all labs ordered are listed, but only abnormal results are displayed) Labs Reviewed - No data to display  EKG  EKG Interpretation None       Radiology No results  found.  Procedures Procedures (including critical care time)  Medications Ordered in ED Medications  magic mouthwash w/lidocaine (15 mLs Oral Given 10/03/16 1556)  hydrOXYzine (ATARAX/VISTARIL) tablet 50 mg (50 mg Oral Given 10/03/16 1524)     Initial Impression / Assessment and Plan / ED Course  I have reviewed the triage vital signs and the nursing notes.  Pertinent labs & imaging results that were available during my care of the patient were reviewed by me and considered in my medical decision making (see chart for details).     56 y.o. female here with c/o anxiety requesting xanax refill, and with c/o herpes outbreak/oral ulcers despite being on acyclovir  QD. On exam, tearful at times, anxious; two small aphthous ulcers in mouth, one to inner upper lip and one to left lateral tongue. Don't actually appear herpetic, but very tender to palpation and mildly erythematous. No evidence of secondary infection or thrush. Handling secretions well. Will give magic mouthwash and acyclovir outbreak tx (previously took  5x/day x10 days for outbreaks, but this is much higher dosing and prolonged dosing that what is recommended in literature; will do  BID x5 days for outbreak, then resume her regular suppressive dose). Offered pt vistaril, initially pt declined and very tearful that we can't provide her benzo refill (which is based on paperwork from 2016; [facility] database reviewed and no benzo rx given in last 27yrs at least). She ultimately accepted getting vistaril here and rx for home, advised that she'll need to f/up with a psychiatrist for ongoing psych meds/refills, and with her PCP in 5-7 days for recheck of symptoms and ongoing medical care. Tobacco cessation strongly advised. I explained the diagnosis and have given explicit precautions to return to the ER including for any other new or worsening symptoms. The patient understands and accepts the medical plan as it's been dictated and I  have answered their questions. Discharge instructions concerning home care and prescriptions have been given. The patient is STABLE and is discharged to home in good condition.    Final Clinical Impressions(s) / ED Diagnoses   Final diagnoses:  Anxiety  Oral herpes  Aphthous ulcer of mouth  Tobacco user    New Prescriptions New Prescriptions   ACYCLOVIR (ZOVIRAX) 800 MG TABLET    Take 1 tablet (800 mg total) by mouth 2 (two) times daily.   HYDROXYZINE (ATARAX/VISTARIL) 50 MG TABLET    Take 1 tablet (50 mg total) by mouth every  6 (six) hours as needed for anxiety.   MAGIC MOUTHWASH W/LIDOCAINE SOLN    Take 15 mLs by mouth 3 (three) times daily as needed for mouth pain. Dispense in a 1/1/1/1 ratio. Use lidocaine, diphenhydramine, alum & mag hydroxide-simeth, and nystatin     [address] [place], [place] 10/03/16 1557    Gerhard Munch, MD 10/03/16 1659

## 2016-10-03 NOTE — ED Notes (Signed)
Discharge instructions reviewed with patient. Patient verbalizes understanding. VSS.   

## 2016-10-03 NOTE — Discharge Instructions (Signed)
Use magic mouthwash as directed as needed for oral ulcer pain. Take acyclovir as directed to help with the herpes outbreak. Take vistaril as directed as needed for anxiety. Stop smoking! Follow up with your primary care doctor in 5-7 days for recheck of symptoms and ongoing medical care, and use the list below to find a psychiatrist for ongoing help with your anxiety. Return to the ER for emergent changes or worsening symptoms.

## 2017-11-28 ENCOUNTER — Emergency Department (HOSPITAL_COMMUNITY)
Admission: EM | Admit: 2017-11-28 | Discharge: 2017-11-28 | Disposition: A | Discharge status: Home / Self Care | Payer: Medicaid Other | Attending: Emergency Medicine | Admitting: Emergency Medicine

## 2017-11-28 DIAGNOSIS — F17210 Nicotine dependence, cigarettes, uncomplicated: Secondary | ICD-10-CM | POA: Insufficient documentation

## 2017-11-28 DIAGNOSIS — Z79899 Other long term (current) drug therapy: Secondary | ICD-10-CM | POA: Diagnosis not present

## 2017-11-28 DIAGNOSIS — Z760 Encounter for issue of repeat prescription: Secondary | ICD-10-CM | POA: Diagnosis not present

## 2017-11-28 DIAGNOSIS — B028 Zoster with other complications: Secondary | ICD-10-CM | POA: Insufficient documentation

## 2017-11-28 DIAGNOSIS — R21 Rash and other nonspecific skin eruption: Secondary | ICD-10-CM | POA: Diagnosis present

## 2017-11-28 DIAGNOSIS — B029 Zoster without complications: Secondary | ICD-10-CM

## 2017-11-28 MED ORDER — VALACYCLOVIR HCL 500 MG PO TABS
1000.0000 mg | ORAL_TABLET | Freq: Once | ORAL | Status: AC
  Administered 2017-11-28: 1000 mg via ORAL
  Filled 2017-11-28: qty 2

## 2017-11-28 MED ORDER — OXYCODONE HCL 5 MG PO TABS
5.0000 mg | ORAL_TABLET | Freq: Once | ORAL | Status: AC
Start: 2017-11-28 — End: 2017-11-28
  Administered 2017-11-28: 5 mg via ORAL
  Filled 2017-11-28: qty 1

## 2017-11-28 MED ORDER — PHENOBARBITAL 32.4 MG PO TABS
97.2000 mg | ORAL_TABLET | Freq: Once | ORAL | Status: AC
  Administered 2017-11-28: 97.2 mg via ORAL
  Filled 2017-11-28: qty 3

## 2017-11-28 MED ORDER — PHENOBARBITAL 97.2 MG PO TABS: 97.2000 mg | ORAL_TABLET | Freq: Three times a day (TID) | ORAL | 0 refills | Status: DC

## 2017-11-28 MED ORDER — VALACYCLOVIR HCL 1 G PO TABS
1000.0000 mg | ORAL_TABLET | Freq: Three times a day (TID) | ORAL | 0 refills | Status: AC
Start: 2017-11-28 — End: 2017-12-05

## 2017-11-28 MED ORDER — GABAPENTIN 800 MG PO TABS: 800.0000 mg | ORAL_TABLET | Freq: Three times a day (TID) | ORAL | 0 refills | Status: AC

## 2017-11-28 MED ORDER — ALPRAZOLAM 2 MG PO TABS: 2.0000 mg | ORAL_TABLET | Freq: Two times a day (BID) | ORAL | 0 refills | Status: AC

## 2017-11-28 MED ORDER — OXYCODONE HCL 5 MG PO TABS: 5.0000 mg | ORAL_TABLET | Freq: Four times a day (QID) | ORAL | 0 refills | Status: DC | PRN
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
## 2017-11-28 NOTE — ED Notes (Signed)
Bed: [id number] Expected date:  Expected time:  Means of arrival:  Comments: Withdrawal and rash

## 2017-11-28 NOTE — Discharge Instructions (Signed)
Please return for any problem. Follow up with Dr. Kathrynn SpeedArvind tomorrow - you have been given small quantities of your regular medicines today. It is imperative that your regular care provider is aware of your urgent need for refills.
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
## 2018-01-22 ENCOUNTER — Emergency Department (HOSPITAL_COMMUNITY): Payer: Medicaid Other

## 2018-01-22 ENCOUNTER — Emergency Department (HOSPITAL_COMMUNITY)
Admission: EM | Admit: 2018-01-22 | Discharge: 2018-01-22 | Disposition: A | Discharge status: Home / Self Care | Payer: Medicaid Other | Attending: Emergency Medicine | Admitting: Emergency Medicine

## 2018-01-22 ENCOUNTER — Encounter (HOSPITAL_COMMUNITY): Payer: Self-pay | Admitting: Emergency Medicine

## 2018-01-22 DIAGNOSIS — Y9389 Activity, other specified: Secondary | ICD-10-CM | POA: Diagnosis not present

## 2018-01-22 DIAGNOSIS — S0501XA Injury of conjunctiva and corneal abrasion without foreign body, right eye, initial encounter: Secondary | ICD-10-CM | POA: Insufficient documentation

## 2018-01-22 DIAGNOSIS — H1131 Conjunctival hemorrhage, right eye: Secondary | ICD-10-CM | POA: Diagnosis not present

## 2018-01-22 DIAGNOSIS — Y998 Other external cause status: Secondary | ICD-10-CM | POA: Diagnosis not present

## 2018-01-22 DIAGNOSIS — S0990XA Unspecified injury of head, initial encounter: Secondary | ICD-10-CM | POA: Insufficient documentation

## 2018-01-22 DIAGNOSIS — Y929 Unspecified place or not applicable: Secondary | ICD-10-CM | POA: Diagnosis not present

## 2018-01-22 DIAGNOSIS — F17210 Nicotine dependence, cigarettes, uncomplicated: Secondary | ICD-10-CM | POA: Diagnosis not present

## 2018-01-22 DIAGNOSIS — Z79899 Other long term (current) drug therapy: Secondary | ICD-10-CM | POA: Insufficient documentation

## 2018-01-22 DIAGNOSIS — R109 Unspecified abdominal pain: Secondary | ICD-10-CM | POA: Diagnosis not present

## 2018-01-22 LAB — I-STAT CHEM 8, ED
BUN: 9 mg/dL (ref 6–20)
Calcium, Ion: 1.04 mmol/L — ABNORMAL LOW (ref 1.15–1.40)
Chloride: 102 mmol/L (ref 98–111)
Creatinine, Ser: 0.5 mg/dL (ref 0.44–1.00)
Glucose, Bld: 90 mg/dL (ref 70–99)
HCT: 41 % (ref 36.0–46.0)
Hemoglobin: 13.9 g/dL (ref 12.0–15.0)
Potassium: 3.2 mmol/L — ABNORMAL LOW (ref 3.5–5.1)
Sodium: 137 mmol/L (ref 135–145)
TCO2: 26 mmol/L (ref 22–32)

## 2018-01-22 MED ORDER — FLUORESCEIN SODIUM 1 MG OP STRP
1.0000 | ORAL_STRIP | Freq: Once | OPHTHALMIC | Status: AC
  Administered 2018-01-22: 1 via OPHTHALMIC
  Filled 2018-01-22: qty 1

## 2018-01-22 MED ORDER — PHENOBARBITAL 97.2 MG PO TABS
97.2000 mg | ORAL_TABLET | Freq: Once | ORAL | Status: AC
  Administered 2018-01-22: 97.2 mg via ORAL
  Filled 2018-01-22: qty 1

## 2018-01-22 MED ORDER — DIAZEPAM 5 MG PO TABS
5.0000 mg | ORAL_TABLET | Freq: Once | ORAL | Status: AC
  Administered 2018-01-22: 5 mg via ORAL
  Filled 2018-01-22: qty 1

## 2018-01-22 MED ORDER — LISINOPRIL 20 MG PO TABS: 20.0000 mg | ORAL_TABLET | Freq: Every day | ORAL | 0 refills | Status: AC

## 2018-01-22 MED ORDER — TETRACAINE HCL 0.5 % OP SOLN
2.0000 [drp] | Freq: Once | OPHTHALMIC | Status: AC
  Administered 2018-01-22: 2 [drp] via OPHTHALMIC
  Filled 2018-01-22: qty 4

## 2018-01-22 MED ORDER — OXYCODONE HCL 5 MG PO TABS
5.0000 mg | ORAL_TABLET | Freq: Once | ORAL | Status: AC
  Administered 2018-01-22: 5 mg via ORAL
  Filled 2018-01-22: qty 1

## 2018-01-22 MED ORDER — KETOROLAC TROMETHAMINE 60 MG/2ML IM SOLN
15.0000 mg | Freq: Once | INTRAMUSCULAR | Status: AC
  Administered 2018-01-22: 15 mg via INTRAMUSCULAR
  Filled 2018-01-22: qty 2

## 2018-01-22 MED ORDER — LISINOPRIL 20 MG PO TABS: 20.0000 mg | ORAL_TABLET | Freq: Once | ORAL | Status: DC

## 2018-01-22 MED ORDER — ACETAMINOPHEN 500 MG PO TABS
1000.0000 mg | ORAL_TABLET | Freq: Once | ORAL | Status: AC
  Administered 2018-01-22: 1000 mg via ORAL
  Filled 2018-01-22: qty 2

## 2018-01-22 MED ORDER — ERYTHROMYCIN 5 MG/GM OP OINT: TOPICAL_OINTMENT | OPHTHALMIC | 0 refills | Status: AC

## 2018-01-22 MED ORDER — PHENOBARBITAL 97.2 MG PO TABS: 97.2000 mg | ORAL_TABLET | Freq: Two times a day (BID) | ORAL | 0 refills | Status: DC

## 2018-01-22 MED ORDER — IOHEXOL 300 MG/ML  SOLN
100.0000 mL | Freq: Once | INTRAMUSCULAR | Status: AC | PRN
  Administered 2018-01-22: 100 mL via INTRAVENOUS

## 2018-01-22 NOTE — ED Notes (Signed)
Pt taken to CT.
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
## 2018-01-22 NOTE — ED Notes (Signed)
Pt given sandwich and Coke to drink
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
## 2018-01-23 ENCOUNTER — Other Ambulatory Visit: Payer: Self-pay

## 2018-01-23 ENCOUNTER — Emergency Department (HOSPITAL_COMMUNITY)
Admission: EM | Admit: 2018-01-23 | Discharge: 2018-01-24 | Disposition: A | Discharge status: Home / Self Care | Payer: Medicaid Other | Attending: Emergency Medicine | Admitting: Emergency Medicine

## 2018-01-23 ENCOUNTER — Encounter (HOSPITAL_COMMUNITY): Payer: Self-pay | Admitting: *Deleted

## 2018-01-23 DIAGNOSIS — R4182 Altered mental status, unspecified: Secondary | ICD-10-CM | POA: Diagnosis not present

## 2018-01-23 DIAGNOSIS — Z590 Homelessness: Secondary | ICD-10-CM | POA: Insufficient documentation

## 2018-01-23 DIAGNOSIS — F1910 Other psychoactive substance abuse, uncomplicated: Secondary | ICD-10-CM

## 2018-01-23 DIAGNOSIS — F17210 Nicotine dependence, cigarettes, uncomplicated: Secondary | ICD-10-CM | POA: Insufficient documentation

## 2018-01-23 DIAGNOSIS — F19920 Other psychoactive substance use, unspecified with intoxication, uncomplicated: Secondary | ICD-10-CM | POA: Diagnosis not present

## 2018-01-23 DIAGNOSIS — Z79899 Other long term (current) drug therapy: Secondary | ICD-10-CM | POA: Diagnosis not present

## 2018-01-23 DIAGNOSIS — R55 Syncope and collapse: Secondary | ICD-10-CM | POA: Diagnosis present
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
## 2018-01-24 ENCOUNTER — Emergency Department (HOSPITAL_COMMUNITY): Payer: Medicaid Other

## 2018-01-24 LAB — PHENOBARBITAL LEVEL: Phenobarbital: 24.5 ug/mL (ref 15.0–30.0)

## 2018-01-24 LAB — I-STAT CHEM 8, ED
BUN: 11 mg/dL (ref 6–20)
CALCIUM ION: 1.09 mmol/L — AB (ref 1.15–1.40)
Chloride: 98 mmol/L (ref 98–111)
Creatinine, Ser: 0.6 mg/dL (ref 0.44–1.00)
Glucose, Bld: 103 mg/dL — ABNORMAL HIGH (ref 70–99)
HCT: 39 % (ref 36.0–46.0)
Hemoglobin: 13.3 g/dL (ref 12.0–15.0)
Potassium: 3.2 mmol/L — ABNORMAL LOW (ref 3.5–5.1)
Sodium: 135 mmol/L (ref 135–145)
TCO2: 27 mmol/L (ref 22–32)

## 2018-01-24 LAB — URINALYSIS, ROUTINE W REFLEX MICROSCOPIC
Bacteria, UA: NONE SEEN
Bilirubin Urine: NEGATIVE
Glucose, UA: NEGATIVE mg/dL
Ketones, ur: NEGATIVE mg/dL
Leukocytes, UA: NEGATIVE
Nitrite: NEGATIVE
Protein, ur: NEGATIVE mg/dL
Specific Gravity, Urine: 1.01 (ref 1.005–1.030)
pH: 6 (ref 5.0–8.0)

## 2018-01-24 LAB — I-STAT BETA HCG BLOOD, ED (MC, WL, AP ONLY): I-stat hCG, quantitative: <5 m[IU]/mL (ref <5)

## 2018-01-24 LAB — RAPID URINE DRUG SCREEN, HOSP PERFORMED
Amphetamines: NOT DETECTED
Barbiturates: POSITIVE — AB
Benzodiazepines: POSITIVE — AB
Cocaine: POSITIVE — AB
Opiates: NOT DETECTED
Tetrahydrocannabinol: NOT DETECTED

## 2018-01-24 LAB — ETHANOL: Alcohol, Ethyl (B): <10 mg/dL (ref <10)

## 2018-01-24 MED ORDER — SODIUM CHLORIDE 0.9 % IV BOLUS
1000.0000 mL | Freq: Once | INTRAVENOUS | Status: AC
  Administered 2018-01-24: 1000 mL via INTRAVENOUS

## 2018-01-24 MED ORDER — SODIUM CHLORIDE 0.9 % IV SOLN
Freq: Once | INTRAVENOUS | Status: AC
  Administered 2018-01-24: 05:00:00 via INTRAVENOUS

## 2018-01-24 NOTE — ED Notes (Signed)
Staff attempted to dress patient and prepare her for discharge. Patient very drowsy and refuses to cooperate. Patient transferred out to hallway bed, will provide patient with food and beverage and try to wake her up more so that she is suitable for discharge.

## 2018-01-24 NOTE — ED Notes (Signed)
Patient ambulated to the bathroom with 2 person assist. Patient unsteady while ambulating; states "I feel like my knees are going to give out at any minute".
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
## 2018-01-24 NOTE — ED Notes (Signed)
ED Provider at bedside. 

## 2018-01-30 ENCOUNTER — Other Ambulatory Visit: Payer: Self-pay

## 2018-01-30 ENCOUNTER — Emergency Department (HOSPITAL_COMMUNITY)
Admission: EM | Admit: 2018-01-30 | Discharge: 2018-01-30 | Disposition: A | Discharge status: Home / Self Care | Payer: Medicaid Other | Attending: Emergency Medicine | Admitting: Emergency Medicine

## 2018-01-30 ENCOUNTER — Encounter (HOSPITAL_COMMUNITY): Payer: Self-pay

## 2018-01-30 DIAGNOSIS — R112 Nausea with vomiting, unspecified: Secondary | ICD-10-CM | POA: Diagnosis not present

## 2018-01-30 DIAGNOSIS — Z5321 Procedure and treatment not carried out due to patient leaving prior to being seen by health care provider: Secondary | ICD-10-CM | POA: Diagnosis not present

## 2018-01-30 LAB — COMPREHENSIVE METABOLIC PANEL
ALT: 13 U/L (ref 0–44)
AST: 17 U/L (ref 15–41)
Albumin: 3.3 g/dL — ABNORMAL LOW (ref 3.5–5.0)
Alkaline Phosphatase: 69 U/L (ref 38–126)
Anion gap: 10 (ref 5–15)
BUN: 8 mg/dL (ref 6–20)
CO2: 25 mmol/L (ref 22–32)
Calcium: 7.8 mg/dL — ABNORMAL LOW (ref 8.9–10.3)
Chloride: 107 mmol/L (ref 98–111)
Creatinine, Ser: 0.39 mg/dL — ABNORMAL LOW (ref 0.44–1.00)
GFR calc non Af Amer: >60 mL/min (ref >60)
Glucose, Bld: 74 mg/dL (ref 70–99)
Potassium: 3.3 mmol/L — ABNORMAL LOW (ref 3.5–5.1)
Sodium: 142 mmol/L (ref 135–145)
Total Bilirubin: 0.7 mg/dL (ref 0.3–1.2)
Total Protein: 6.1 g/dL — ABNORMAL LOW (ref 6.5–8.1)

## 2018-01-30 LAB — CBC
HCT: 40.2 % (ref 36.0–46.0)
Hemoglobin: 12.7 g/dL (ref 12.0–15.0)
MCH: 33.1 pg (ref 26.0–34.0)
MCHC: 31.6 g/dL (ref 30.0–36.0)
MCV: 104.7 fL — AB (ref 80.0–100.0)
NRBC: 0 % (ref 0.0–0.2)
Platelets: 295 10*3/uL (ref 150–400)
RBC: 3.84 MIL/uL — ABNORMAL LOW (ref 3.87–5.11)
RDW: 14.6 % (ref 11.5–15.5)
WBC: 12.5 10*3/uL — ABNORMAL HIGH (ref 4.0–10.5)

## 2018-01-30 LAB — LIPASE, BLOOD: Lipase: 35 U/L (ref 11–51)

## 2018-01-30 MED ORDER — SODIUM CHLORIDE 0.9% FLUSH: 3.0000 mL | Freq: Once | INTRAVENOUS | Status: DC

## 2018-01-30 NOTE — ED Notes (Signed)
Second call for blood draw with no answer.. 

## 2018-01-30 NOTE — ED Triage Notes (Signed)
Per EMS- patient is homeless. Patient  c/o N/V/D and flu symptoms. patient smells of ETOH and admits to drinking 1 beer today.  Patient reports that she ran out of her seizure meds x 1 week.

## 2018-01-30 NOTE — ED Notes (Signed)
No answer for blood draw @ this time. 
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
## 2018-02-18 ENCOUNTER — Other Ambulatory Visit: Payer: Self-pay

## 2018-02-18 ENCOUNTER — Encounter (HOSPITAL_COMMUNITY): Payer: Self-pay | Admitting: Emergency Medicine

## 2018-02-18 ENCOUNTER — Emergency Department (HOSPITAL_COMMUNITY)
Admission: EM | Admit: 2018-02-18 | Discharge: 2018-02-18 | Disposition: A | Discharge status: Home / Self Care | Payer: Medicaid Other | Attending: Emergency Medicine | Admitting: Emergency Medicine

## 2018-02-18 DIAGNOSIS — R569 Unspecified convulsions: Secondary | ICD-10-CM | POA: Diagnosis present

## 2018-02-18 DIAGNOSIS — Z5321 Procedure and treatment not carried out due to patient leaving prior to being seen by health care provider: Secondary | ICD-10-CM | POA: Diagnosis not present

## 2018-02-18 LAB — PHENOBARBITAL LEVEL: PHENOBARBITAL: 40.2 ug/mL — AB (ref 15.0–30.0)

## 2018-02-18 NOTE — ED Notes (Signed)
No answer x1 for vitals recheck 
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
## 2018-04-14 ENCOUNTER — Encounter (HOSPITAL_COMMUNITY): Payer: Self-pay | Admitting: Emergency Medicine

## 2018-04-14 ENCOUNTER — Emergency Department (HOSPITAL_COMMUNITY): Payer: Medicaid Other

## 2018-04-14 ENCOUNTER — Emergency Department (HOSPITAL_COMMUNITY)
Admission: EM | Admit: 2018-04-14 | Discharge: 2018-04-15 | Disposition: A | Discharge status: Home / Self Care | Payer: Medicaid Other | Attending: Emergency Medicine | Admitting: Emergency Medicine

## 2018-04-14 ENCOUNTER — Other Ambulatory Visit: Payer: Self-pay

## 2018-04-14 DIAGNOSIS — F10929 Alcohol use, unspecified with intoxication, unspecified: Secondary | ICD-10-CM

## 2018-04-14 DIAGNOSIS — Z23 Encounter for immunization: Secondary | ICD-10-CM | POA: Diagnosis not present

## 2018-04-14 DIAGNOSIS — F17210 Nicotine dependence, cigarettes, uncomplicated: Secondary | ICD-10-CM | POA: Insufficient documentation

## 2018-04-14 DIAGNOSIS — I10 Essential (primary) hypertension: Secondary | ICD-10-CM | POA: Diagnosis not present

## 2018-04-14 DIAGNOSIS — S51812A Laceration without foreign body of left forearm, initial encounter: Secondary | ICD-10-CM | POA: Insufficient documentation

## 2018-04-14 DIAGNOSIS — Y939 Activity, unspecified: Secondary | ICD-10-CM | POA: Insufficient documentation

## 2018-04-14 DIAGNOSIS — F10229 Alcohol dependence with intoxication, unspecified: Secondary | ICD-10-CM | POA: Diagnosis not present

## 2018-04-14 DIAGNOSIS — Y998 Other external cause status: Secondary | ICD-10-CM | POA: Insufficient documentation

## 2018-04-14 DIAGNOSIS — Y92410 Unspecified street and highway as the place of occurrence of the external cause: Secondary | ICD-10-CM | POA: Diagnosis not present

## 2018-04-14 DIAGNOSIS — Z79899 Other long term (current) drug therapy: Secondary | ICD-10-CM | POA: Diagnosis not present

## 2018-04-14 DIAGNOSIS — S0990XA Unspecified injury of head, initial encounter: Secondary | ICD-10-CM | POA: Insufficient documentation

## 2018-04-14 LAB — BASIC METABOLIC PANEL
Anion gap: 12 (ref 5–15)
BUN: 16 mg/dL (ref 6–20)
CO2: 21 mmol/L — ABNORMAL LOW (ref 22–32)
Calcium: 8.2 mg/dL — ABNORMAL LOW (ref 8.9–10.3)
Chloride: 106 mmol/L (ref 98–111)
Creatinine, Ser: 0.81 mg/dL (ref 0.44–1.00)
GFR calc Af Amer: >60 mL/min (ref >60)
GFR calc non Af Amer: >60 mL/min (ref >60)
Glucose, Bld: 93 mg/dL (ref 70–99)
Potassium: 4.6 mmol/L (ref 3.5–5.1)
Sodium: 139 mmol/L (ref 135–145)

## 2018-04-14 LAB — CBC WITH DIFFERENTIAL/PLATELET
Abs Immature Granulocytes: 0.01 10*3/uL (ref 0.00–0.07)
Basophils Absolute: 0.1 10*3/uL (ref 0.0–0.1)
Basophils Relative: 1 %
Eosinophils Absolute: 0.3 10*3/uL (ref 0.0–0.5)
Eosinophils Relative: 4 %
HCT: 46 % (ref 36.0–46.0)
Hemoglobin: 15.2 g/dL — ABNORMAL HIGH (ref 12.0–15.0)
Immature Granulocytes: 0 %
Lymphocytes Relative: 33 %
Lymphs Abs: 2.4 10*3/uL (ref 0.7–4.0)
MCH: 32 pg (ref 26.0–34.0)
MCHC: 33 g/dL (ref 30.0–36.0)
MCV: 96.8 fL (ref 80.0–100.0)
Monocytes Absolute: 0.5 10*3/uL (ref 0.1–1.0)
Monocytes Relative: 7 %
Neutro Abs: 4 10*3/uL (ref 1.7–7.7)
Neutrophils Relative %: 55 %
Platelets: 314 10*3/uL (ref 150–400)
RBC: 4.75 MIL/uL (ref 3.87–5.11)
RDW: 14.1 % (ref 11.5–15.5)
WBC: 7.2 10*3/uL (ref 4.0–10.5)
nRBC: 0 % (ref 0.0–0.2)

## 2018-04-14 LAB — ETHANOL: Alcohol, Ethyl (B): 300 mg/dL — ABNORMAL HIGH (ref <10)

## 2018-04-14 MED ORDER — TETANUS-DIPHTH-ACELL PERTUSSIS 5-2.5-18.5 LF-MCG/0.5 IM SUSP
0.5000 mL | Freq: Once | INTRAMUSCULAR | Status: AC
  Administered 2018-04-14: 0.5 mL via INTRAMUSCULAR
  Filled 2018-04-14: qty 0.5

## 2018-04-14 MED ORDER — OXYCODONE-ACETAMINOPHEN 5-325 MG PO TABS: 1.0000 | ORAL_TABLET | Freq: Once | ORAL | Status: DC

## 2018-04-14 MED ORDER — LIDOCAINE-EPINEPHRINE (PF) 2 %-1:200000 IJ SOLN
INTRAMUSCULAR | Status: AC
  Filled 2018-04-14: qty 20

## 2018-04-14 MED ORDER — OXYCODONE-ACETAMINOPHEN 5-325 MG PO TABS
2.0000 | ORAL_TABLET | Freq: Once | ORAL | Status: DC
  Filled 2018-04-14: qty 2

## 2018-04-14 NOTE — ED Notes (Signed)
Patient transported to X-ray 
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
## 2018-04-14 NOTE — ED Notes (Signed)
Patient transported to CT 
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
## 2018-04-15 MED ORDER — OXYCODONE-ACETAMINOPHEN 5-325 MG PO TABS
2.0000 | ORAL_TABLET | Freq: Once | ORAL | Status: AC
  Administered 2018-04-15: 03:00:00 2 via ORAL
  Filled 2018-04-15: qty 2

## 2018-04-15 MED ORDER — ACETAMINOPHEN 325 MG PO TABS
650.0000 mg | ORAL_TABLET | Freq: Once | ORAL | Status: DC
  Filled 2018-04-15: qty 2

## 2018-04-15 NOTE — ED Notes (Signed)
Pt requesting food, beverage and pain medication.  Pt states she does not have anyone who would be able to come get her from the ED

## 2018-04-15 NOTE — ED Notes (Signed)
Pt refused tylenol and states that "I normally take Roxi 10's." Pt yelling and getting agitated at RN. RN tried to explain that tylenol will help but pt started yelling again.

## 2018-04-15 NOTE — ED Notes (Signed)
ED Provider at bedside. 

## 2018-04-15 NOTE — ED Notes (Signed)
Pt discharged from facility. Pt verbalized understanding of discharge instructions. RN attempted to go over community resources and pt stated "you don't even need to go over all that." Pt wheel out on wheel chair. No signature pad available.

## 2018-04-19 ENCOUNTER — Other Ambulatory Visit: Payer: Self-pay

## 2018-04-19 ENCOUNTER — Encounter (HOSPITAL_COMMUNITY): Payer: Self-pay | Admitting: Emergency Medicine

## 2018-04-19 ENCOUNTER — Emergency Department (HOSPITAL_COMMUNITY): Payer: Medicaid Other

## 2018-04-19 ENCOUNTER — Inpatient Hospital Stay (HOSPITAL_COMMUNITY)
Admission: EM | Admit: 2018-04-19 | Discharge: 2018-04-20 | DRG: 917 | Discharge status: Against Medical Advice | Payer: Medicaid Other | Attending: Internal Medicine | Admitting: Internal Medicine

## 2018-04-19 DIAGNOSIS — Z87828 Personal history of other (healed) physical injury and trauma: Secondary | ICD-10-CM

## 2018-04-19 DIAGNOSIS — R400 Somnolence: Secondary | ICD-10-CM

## 2018-04-19 DIAGNOSIS — E46 Unspecified protein-calorie malnutrition: Secondary | ICD-10-CM | POA: Diagnosis not present

## 2018-04-19 DIAGNOSIS — F1910 Other psychoactive substance abuse, uncomplicated: Secondary | ICD-10-CM

## 2018-04-19 DIAGNOSIS — R402222 Coma scale, best verbal response, incomprehensible words, at arrival to emergency department: Secondary | ICD-10-CM | POA: Diagnosis present

## 2018-04-19 DIAGNOSIS — G9340 Encephalopathy, unspecified: Secondary | ICD-10-CM | POA: Diagnosis present

## 2018-04-19 DIAGNOSIS — G40909 Epilepsy, unspecified, not intractable, without status epilepticus: Secondary | ICD-10-CM | POA: Diagnosis not present

## 2018-04-19 DIAGNOSIS — Y92008 Other place in unspecified non-institutional (private) residence as the place of occurrence of the external cause: Secondary | ICD-10-CM

## 2018-04-19 DIAGNOSIS — F1010 Alcohol abuse, uncomplicated: Secondary | ICD-10-CM | POA: Diagnosis present

## 2018-04-19 DIAGNOSIS — G40209 Localization-related (focal) (partial) symptomatic epilepsy and epileptic syndromes with complex partial seizures, not intractable, without status epilepticus: Secondary | ICD-10-CM | POA: Diagnosis present

## 2018-04-19 DIAGNOSIS — F17210 Nicotine dependence, cigarettes, uncomplicated: Secondary | ICD-10-CM | POA: Diagnosis present

## 2018-04-19 DIAGNOSIS — Z7289 Other problems related to lifestyle: Secondary | ICD-10-CM

## 2018-04-19 DIAGNOSIS — Z590 Homelessness: Secondary | ICD-10-CM

## 2018-04-19 DIAGNOSIS — Z79891 Long term (current) use of opiate analgesic: Secondary | ICD-10-CM

## 2018-04-19 DIAGNOSIS — R402352 Coma scale, best motor response, localizes pain, at arrival to emergency department: Secondary | ICD-10-CM | POA: Diagnosis present

## 2018-04-19 DIAGNOSIS — R825 Elevated urine levels of drugs, medicaments and biological substances: Secondary | ICD-10-CM | POA: Diagnosis not present

## 2018-04-19 DIAGNOSIS — G92 Toxic encephalopathy: Secondary | ICD-10-CM | POA: Diagnosis present

## 2018-04-19 DIAGNOSIS — Z79899 Other long term (current) drug therapy: Secondary | ICD-10-CM

## 2018-04-19 DIAGNOSIS — R402122 Coma scale, eyes open, to pain, at arrival to emergency department: Secondary | ICD-10-CM | POA: Diagnosis present

## 2018-04-19 DIAGNOSIS — B009 Herpesviral infection, unspecified: Secondary | ICD-10-CM | POA: Diagnosis present

## 2018-04-19 DIAGNOSIS — T423X1A Poisoning by barbiturates, accidental (unintentional), initial encounter: Principal | ICD-10-CM | POA: Diagnosis present

## 2018-04-19 LAB — CBC
HCT: 47.2 % — ABNORMAL HIGH (ref 36.0–46.0)
HCT: 45.4 % (ref 36.0–46.0)
Hemoglobin: 15.5 g/dL — ABNORMAL HIGH (ref 12.0–15.0)
Hemoglobin: 15.1 g/dL — ABNORMAL HIGH (ref 12.0–15.0)
MCH: 32.5 pg (ref 26.0–34.0)
MCH: 32.1 pg (ref 26.0–34.0)
MCHC: 32.8 g/dL (ref 30.0–36.0)
MCHC: 33.3 g/dL (ref 30.0–36.0)
MCV: 99 fL (ref 80.0–100.0)
MCV: 96.4 fL (ref 80.0–100.0)
Platelets: 213 10*3/uL (ref 150–400)
Platelets: 226 10*3/uL (ref 150–400)
RBC: 4.77 MIL/uL (ref 3.87–5.11)
RBC: 4.71 MIL/uL (ref 3.87–5.11)
RDW: 13.4 % (ref 11.5–15.5)
RDW: 13.2 % (ref 11.5–15.5)
WBC: 4.4 10*3/uL (ref 4.0–10.5)
WBC: 9.1 10*3/uL (ref 4.0–10.5)
nRBC: 0 % (ref 0.0–0.2)
nRBC: 0 % (ref 0.0–0.2)

## 2018-04-19 LAB — URINALYSIS, ROUTINE W REFLEX MICROSCOPIC
Bilirubin Urine: NEGATIVE
Glucose, UA: NEGATIVE mg/dL
Ketones, ur: NEGATIVE mg/dL
Nitrite: NEGATIVE
Protein, ur: NEGATIVE mg/dL
Specific Gravity, Urine: 1.013 (ref 1.005–1.030)
pH: 6 (ref 5.0–8.0)

## 2018-04-19 LAB — OSMOLALITY: Osmolality: 293 mOsm/kg (ref 275–295)

## 2018-04-19 LAB — COMPREHENSIVE METABOLIC PANEL
ALT: 12 U/L (ref 0–44)
AST: 15 U/L (ref 15–41)
Albumin: 3.1 g/dL — ABNORMAL LOW (ref 3.5–5.0)
Alkaline Phosphatase: 88 U/L (ref 38–126)
Anion gap: 9 (ref 5–15)
BUN: 7 mg/dL (ref 6–20)
CO2: 26 mmol/L (ref 22–32)
Calcium: 8.6 mg/dL — ABNORMAL LOW (ref 8.9–10.3)
Chloride: 101 mmol/L (ref 98–111)
Creatinine, Ser: 0.58 mg/dL (ref 0.44–1.00)
GFR calc Af Amer: >60 mL/min (ref >60)
GFR calc non Af Amer: >60 mL/min (ref >60)
Glucose, Bld: 101 mg/dL — ABNORMAL HIGH (ref 70–99)
Potassium: 3.6 mmol/L (ref 3.5–5.1)
Sodium: 136 mmol/L (ref 135–145)
Total Bilirubin: 0.4 mg/dL (ref 0.3–1.2)
Total Protein: 5.9 g/dL — ABNORMAL LOW (ref 6.5–8.1)

## 2018-04-19 LAB — DIFFERENTIAL
Abs Immature Granulocytes: 0.01 10*3/uL (ref 0.00–0.07)
Basophils Absolute: 0 10*3/uL (ref 0.0–0.1)
Basophils Relative: 1 %
Eosinophils Absolute: 0.3 10*3/uL (ref 0.0–0.5)
Eosinophils Relative: 6 %
Immature Granulocytes: 0 %
Lymphocytes Relative: 43 %
Lymphs Abs: 1.9 10*3/uL (ref 0.7–4.0)
Monocytes Absolute: 0.3 10*3/uL (ref 0.1–1.0)
Monocytes Relative: 8 %
Neutro Abs: 1.8 10*3/uL (ref 1.7–7.7)
Neutrophils Relative %: 42 %

## 2018-04-19 LAB — RAPID URINE DRUG SCREEN, HOSP PERFORMED
Amphetamines: NOT DETECTED
Barbiturates: POSITIVE — AB
Benzodiazepines: NOT DETECTED
Cocaine: NOT DETECTED
Opiates: NOT DETECTED
Tetrahydrocannabinol: NOT DETECTED

## 2018-04-19 LAB — POCT I-STAT EG7
Acid-Base Excess: 4 mmol/L — ABNORMAL HIGH (ref 0.0–2.0)
Bicarbonate: 30 mmol/L — ABNORMAL HIGH (ref 20.0–28.0)
Calcium, Ion: 1.09 mmol/L — ABNORMAL LOW (ref 1.15–1.40)
HCT: 45 % (ref 36.0–46.0)
Hemoglobin: 15.3 g/dL — ABNORMAL HIGH (ref 12.0–15.0)
O2 Saturation: 75 %
Potassium: 3.8 mmol/L (ref 3.5–5.1)
Sodium: 137 mmol/L (ref 135–145)
TCO2: 31 mmol/L (ref 22–32)
pCO2, Ven: 47.3 mmHg (ref 44.0–60.0)
pH, Ven: 7.411 (ref 7.250–7.430)
pO2, Ven: 40 mmHg (ref 32.0–45.0)

## 2018-04-19 LAB — CREATININE, SERUM
Creatinine, Ser: 0.67 mg/dL (ref 0.44–1.00)
GFR calc Af Amer: >60 mL/min (ref >60)
GFR calc non Af Amer: >60 mL/min (ref >60)

## 2018-04-19 LAB — PHENOBARBITAL LEVEL
Phenobarbital: 139.4 ug/mL (ref 15.0–30.0)
Phenobarbital: 140.5 ug/mL (ref 15.0–30.0)

## 2018-04-19 LAB — ACETAMINOPHEN LEVEL: Acetaminophen (Tylenol), Serum: <10 ug/mL — ABNORMAL LOW (ref 10–30)

## 2018-04-19 LAB — SALICYLATE LEVEL: Salicylate Lvl: <7 mg/dL (ref 2.8–30.0)

## 2018-04-19 LAB — CBG MONITORING, ED: Glucose-Capillary: 90 mg/dL (ref 70–99)

## 2018-04-19 LAB — CK: Total CK: 70 U/L (ref 38–234)

## 2018-04-19 LAB — ETHANOL: Alcohol, Ethyl (B): <10 mg/dL (ref <10)

## 2018-04-19 MED ORDER — SODIUM BICARBONATE 8.4 % IV SOLN
50.0000 meq | Freq: Once | INTRAVENOUS | Status: AC
  Administered 2018-04-19: 50 meq via INTRAVENOUS
  Filled 2018-04-19: qty 50

## 2018-04-19 MED ORDER — SODIUM CHLORIDE 0.9 % IV SOLN: INTRAVENOUS | Status: DC

## 2018-04-19 MED ORDER — SODIUM CHLORIDE 0.9 % IV BOLUS
1000.0000 mL | Freq: Once | INTRAVENOUS | Status: AC
  Administered 2018-04-19: 1000 mL via INTRAVENOUS

## 2018-04-19 MED ORDER — SODIUM CHLORIDE 0.9 % IV SOLN
INTRAVENOUS | Status: DC
  Administered 2018-04-19: 14:00:00 via INTRAVENOUS

## 2018-04-19 MED ORDER — SODIUM CHLORIDE 0.9% FLUSH
3.0000 mL | Freq: Two times a day (BID) | INTRAVENOUS | Status: DC
  Administered 2018-04-19 – 2018-04-20 (×2): 3 mL via INTRAVENOUS

## 2018-04-19 MED ORDER — ACETAMINOPHEN 325 MG PO TABS
650.0000 mg | ORAL_TABLET | Freq: Four times a day (QID) | ORAL | Status: DC | PRN
  Administered 2018-04-19 – 2018-04-20 (×2): 650 mg via ORAL
  Filled 2018-04-19 (×2): qty 2

## 2018-04-19 MED ORDER — ACETAMINOPHEN 650 MG RE SUPP: 650.0000 mg | Freq: Four times a day (QID) | RECTAL | Status: DC | PRN

## 2018-04-19 MED ORDER — THIAMINE HCL 100 MG/ML IJ SOLN
Freq: Once | INTRAVENOUS | Status: DC
  Filled 2018-04-19: qty 1000

## 2018-04-19 MED ORDER — SODIUM BICARBONATE 8.4 % IV SOLN
INTRAVENOUS | Status: DC
  Administered 2018-04-19 (×2): via INTRAVENOUS
  Filled 2018-04-19 (×6): qty 1000

## 2018-04-19 MED ORDER — NALOXONE HCL 0.4 MG/ML IJ SOLN
0.4000 mg | Freq: Once | INTRAMUSCULAR | Status: AC
  Administered 2018-04-19: 0.4 mg via INTRAVENOUS
  Filled 2018-04-19: qty 1

## 2018-04-19 MED ORDER — SODIUM CHLORIDE 0.9% FLUSH: 3.0000 mL | Freq: Once | INTRAVENOUS | Status: DC

## 2018-04-19 MED ORDER — SODIUM BICARBONATE 8.4 % IV SOLN
INTRAVENOUS | Status: DC
  Filled 2018-04-19 (×3): qty 150

## 2018-04-19 MED ORDER — ENOXAPARIN SODIUM 40 MG/0.4ML ~~LOC~~ SOLN
40.0000 mg | SUBCUTANEOUS | Status: DC
  Administered 2018-04-19: 40 mg via SUBCUTANEOUS
  Filled 2018-04-19: qty 0.4
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
## 2018-04-19 NOTE — ED Notes (Signed)
Patient is resting on stretcher opens eyes very slightly when spoken to. Pupils 4 and reactive bilaterally .
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
## 2018-04-19 NOTE — ED Notes (Signed)
Patient will  Open her eyes when spoken to , however unable to understand words. Withdraws from pain.

## 2018-04-19 NOTE — ED Notes (Signed)
No response noted after receiving narcan. Pt still asleep and responsive to painful stimuli. EDP aware.

## 2018-04-19 NOTE — ED Notes (Signed)
Attempted to sit patient up ,patient is still very sleepy
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
## 2018-04-19 NOTE — Progress Notes (Signed)
After further discussion with poison control, they recommend bicarb bolus followed by infusion of D5W with KCkl and sodium bicarb. Patient also to be evaluated for urgent HD.  - Consulted nephrology for HD - 50 mEq Bicarb, follow by infusion of D5W +45mEq kcl + 150 mEq sodium bicarb at 150cc/hr - Continue to monitor  Ginette Otto, DO Internal Medicine, PGY2
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
## 2018-04-19 NOTE — ED Notes (Signed)
Patient starting to wake up more moaning,

## 2018-04-19 NOTE — ED Notes (Signed)
Stiches and skin tear on left arm cleansed and dressed with bacitracin, nonadherent pad, gauze, and gauze wrap.
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
## 2018-04-19 NOTE — ED Notes (Signed)
Patient was incont. Of urine , patient turned  And linens changed.

## 2018-04-19 NOTE — Progress Notes (Addendum)
Patient arrived to the unit. Alert to self. Patient has generalized bruising, abrasion on face and right knee, and stiches on left arm covered in kerlix. Call bell within reach. Nurse will continue to monitor. Sandra Francis

## 2018-04-20 DIAGNOSIS — Z5329 Procedure and treatment not carried out because of patient's decision for other reasons: Secondary | ICD-10-CM

## 2018-04-20 DIAGNOSIS — F17210 Nicotine dependence, cigarettes, uncomplicated: Secondary | ICD-10-CM | POA: Diagnosis present

## 2018-04-20 DIAGNOSIS — R400 Somnolence: Secondary | ICD-10-CM | POA: Diagnosis not present

## 2018-04-20 DIAGNOSIS — B009 Herpesviral infection, unspecified: Secondary | ICD-10-CM | POA: Diagnosis present

## 2018-04-20 DIAGNOSIS — F1010 Alcohol abuse, uncomplicated: Secondary | ICD-10-CM | POA: Diagnosis present

## 2018-04-20 DIAGNOSIS — T423X1A Poisoning by barbiturates, accidental (unintentional), initial encounter: Secondary | ICD-10-CM | POA: Diagnosis present

## 2018-04-20 DIAGNOSIS — G40209 Localization-related (focal) (partial) symptomatic epilepsy and epileptic syndromes with complex partial seizures, not intractable, without status epilepticus: Secondary | ICD-10-CM | POA: Diagnosis present

## 2018-04-20 DIAGNOSIS — Z79899 Other long term (current) drug therapy: Secondary | ICD-10-CM | POA: Diagnosis not present

## 2018-04-20 DIAGNOSIS — G40909 Epilepsy, unspecified, not intractable, without status epilepticus: Secondary | ICD-10-CM | POA: Diagnosis not present

## 2018-04-20 DIAGNOSIS — Z79891 Long term (current) use of opiate analgesic: Secondary | ICD-10-CM | POA: Diagnosis not present

## 2018-04-20 DIAGNOSIS — R402122 Coma scale, eyes open, to pain, at arrival to emergency department: Secondary | ICD-10-CM | POA: Diagnosis present

## 2018-04-20 DIAGNOSIS — Z7289 Other problems related to lifestyle: Secondary | ICD-10-CM | POA: Diagnosis not present

## 2018-04-20 DIAGNOSIS — G92 Toxic encephalopathy: Secondary | ICD-10-CM | POA: Diagnosis present

## 2018-04-20 DIAGNOSIS — Z590 Homelessness: Secondary | ICD-10-CM | POA: Diagnosis not present

## 2018-04-20 DIAGNOSIS — Y92008 Other place in unspecified non-institutional (private) residence as the place of occurrence of the external cause: Secondary | ICD-10-CM | POA: Diagnosis not present

## 2018-04-20 DIAGNOSIS — R402222 Coma scale, best verbal response, incomprehensible words, at arrival to emergency department: Secondary | ICD-10-CM | POA: Diagnosis present

## 2018-04-20 DIAGNOSIS — R402352 Coma scale, best motor response, localizes pain, at arrival to emergency department: Secondary | ICD-10-CM | POA: Diagnosis present

## 2018-04-20 LAB — CBC
HCT: 42.9 % (ref 36.0–46.0)
Hemoglobin: 14 g/dL (ref 12.0–15.0)
MCH: 31.9 pg (ref 26.0–34.0)
MCHC: 32.6 g/dL (ref 30.0–36.0)
MCV: 97.7 fL (ref 80.0–100.0)
Platelets: 165 10*3/uL (ref 150–400)
RBC: 4.39 MIL/uL (ref 3.87–5.11)
RDW: 13.2 % (ref 11.5–15.5)
WBC: 5.3 10*3/uL (ref 4.0–10.5)
nRBC: 0 % (ref 0.0–0.2)

## 2018-04-20 LAB — COMPREHENSIVE METABOLIC PANEL
ALT: 10 U/L (ref 0–44)
AST: 16 U/L (ref 15–41)
Albumin: 2.6 g/dL — ABNORMAL LOW (ref 3.5–5.0)
Alkaline Phosphatase: 75 U/L (ref 38–126)
Anion gap: 11 (ref 5–15)
BUN: 5 mg/dL — ABNORMAL LOW (ref 6–20)
CO2: 30 mmol/L (ref 22–32)
Calcium: 8.3 mg/dL — ABNORMAL LOW (ref 8.9–10.3)
Chloride: 97 mmol/L — ABNORMAL LOW (ref 98–111)
Creatinine, Ser: 0.58 mg/dL (ref 0.44–1.00)
GFR calc Af Amer: >60 mL/min (ref >60)
GFR calc non Af Amer: >60 mL/min (ref >60)
Glucose, Bld: 108 mg/dL — ABNORMAL HIGH (ref 70–99)
Potassium: 3.9 mmol/L (ref 3.5–5.1)
Sodium: 138 mmol/L (ref 135–145)
Total Bilirubin: 0.3 mg/dL (ref 0.3–1.2)
Total Protein: 5.1 g/dL — ABNORMAL LOW (ref 6.5–8.1)

## 2018-04-20 LAB — HIV ANTIBODY (ROUTINE TESTING W REFLEX): HIV Screen 4th Generation wRfx: NONREACTIVE

## 2018-04-20 LAB — PHENOBARBITAL LEVEL
Phenobarbital: 113.1 ug/mL (ref 15.0–30.0)
Phenobarbital: 106.4 ug/mL (ref 15.0–30.0)
Phenobarbital: 108.3 ug/mL (ref 15.0–30.0)

## 2018-04-20 MED ORDER — SODIUM BICARBONATE 8.4 % IV SOLN
INTRAVENOUS | Status: DC
  Filled 2018-04-20 (×4): qty 1000

## 2018-04-20 MED ORDER — SODIUM BICARBONATE 8.4 % IV SOLN
INTRAVENOUS | Status: DC
  Administered 2018-04-20: 11:00:00 via INTRAVENOUS
  Filled 2018-04-20 (×10): qty 150

## 2018-04-20 NOTE — Discharge Instructions (Signed)
Phenobarbital tablets What is this medicine? PHENOBARBITAL (fee noe BAR bi tal) is a barbiturate. It may be used to help you sleep or may be used to help control seizures. This medicine may be used for other purposes; ask your health care provider or pharmacist if you have questions. What should I tell my health care provider before I take this medicine? They need to know if you have any of these conditions: -drug abuse or addiction -if you frequently drink alcohol containing drinks -kidney disease -liver disease -lung disease or breathing problems -porphyria -suicidal thoughts, plans, or attempt; a previous suicide attempt by you or a family member -an unusual or allergic reaction to phenobarbital, other barbiturates, other medicines, lactose, foods, dyes, or preservatives -pregnant or trying to get pregnant -breast-feeding How should I use this medicine? Take this medicine by mouth with a full glass of water. Follow the directions on the prescription label. Take your medicine at regular intervals. Do not take your medicine more often than directed. Do not stop taking except on your doctor's advice. If you have been taking this medicine regularly and suddenly stop taking it, you may increase the risk of seizures. Your doctor may gradually reduce the dose. Talk to your pediatrician regarding the use of this medicine in children. Special care may be needed. Patients over 14 years old may have a stronger reaction and need a smaller dose. Overdosage: If you think you have taken too much of this medicine contact a poison control center or emergency room at once. NOTE: This medicine is only for you. Do not share this medicine with others. What if I miss a dose? If you miss a dose, take it as soon as you can. If it is almost time for your next dose, take only that dose. Do not take double or extra doses. What may interact with this medicine? Do not take this medicine with any of the following  medications: -certain medicines used to treat HIV infection or AIDS that are given in combination with cobicistat - other barbiturates - primidone - voriconazole This medicine may also interact with the following medications: - alcohol or medicines that contain alcohol - antihistamines - medicines for depression, anxiety, or psychotic disturbances - medicines for pain including pentazocine, buprenorphine, butorphanol, nalbuphine, tramadol, and propoxyphene - medicines for sleep - muscle relaxants - steroid medicines like prednisone or cortisone This list may not describe all possible interactions. Give your health care provider a list of all the medicines, herbs, non-prescription drugs, or dietary supplements you use. Also tell them if you smoke, drink alcohol, or use illegal drugs. Some items may interact with your medicine. What should I watch for while using this medicine? Visit your doctor or health care professional for regular checks on your progress. If you are taking this medicine for seizures, wear a medical identification bracelet or chain to say you have seizures, and carry a card that lists all your medications. You may get drowsy or dizzy. Do not drive, use machinery, or do anything that needs mental alertness until you know how this medicine affects you. Do not stand or sit up quickly, especially if you are an older patient. This reduces the risk of dizzy or fainting spells. Alcohol may interfere with the effect of this medicine. Avoid alcoholic drinks. Birth control pills may not work properly while you are taking this medicine. Talk to your doctor about using an extra method of birth control. The use of this medicine may increase the chance of  suicidal thoughts or actions. Pay special attention to how you are responding while on this medicine. Any worsening of mood, or thoughts of suicide or dying should be reported to your health care professional right away. Women who  become pregnant while using this medicine may enroll in the [place] Antiepileptic Drug Pregnancy Registry by calling [phone]. This registry collects information about the safety of antiepileptic drug use during pregnancy. This medicine may cause a decrease in vitamin D and folic acid. You should make sure that you get enough vitamins while you are taking this medicine. Discuss the foods you eat and the vitamins you take with your health care professional. What side effects may I notice from receiving this medicine? Side effects that you should report to your doctor or health care professional as soon as possible: -allergic reactions like skin rash, itching or hives, swelling of the face, lips, or tongue -breathing problems -fever, chills, or sore throat -redness, blistering, peeling or loosening of the skin, including inside the mouth -unusual bleeding or bruising -unusually weak or tired -worsening of mood, thoughts or actions of suicide or dying -yellowing of the eyes or skin Side effects that usually do not require medical attention (report to your doctor or health care professional if they continue or are bothersome): -dizziness -drowsiness -headache -irritability or nervousness -nausea This list may not describe all possible side effects. Call your doctor for medical advice about side effects. You may report side effects to FDA at 1-800-FDA-1088. Where should I keep my medicine? Keep out of the reach of children. This medicine can be abused. Keep your medicine in a safe place to protect it from theft. Do not share this medicine with anyone. Selling or giving away this medicine is dangerous and against the law. This medicine may cause accidental overdose and death if taken by other adults, children, or pets. Mix any unused medicine with a substance like cat litter or coffee grounds. Then throw the medicine away in a sealed container like a sealed bag or a coffee can with a lid.  Do not use the medicine after the expiration date. Store at room temperature between 20 and 25 degrees C (68 and 77 degrees F). Keep container tightly closed. Protect from light. NOTE: This sheet is a summary. It may not cover all possible information. If you have questions about this medicine, talk to your doctor, pharmacist, or health care provider.  2019 Elsevier/Gold Standard (2016-09-04 14:16:06)
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
## 2018-04-20 NOTE — Progress Notes (Signed)
MD notified about patient wanting to leave AMA. Nurse will continue to follow-up.

## 2018-04-20 NOTE — Progress Notes (Signed)
[facility] paged by staff that patient had been expressing a wish to leave AMA. We went to see patient at bedside to discuss why she wanted wanted to leave. She stated that she wanted to go home because she would rather be at home. She states her home is safe. She again denies intention to harm herself or others. She understands and is able to repeat back that she was admitted due to having too much of her medication in her blood stream. She is able to repeat back that she is at risk of coma and/or death if she leaves AMA and we are unable to complete her treatment.  She has been advised to stay; but, she was informed that if she does leave, she is to stop taking phenobarbital and follow up with her PCP on Monday to have her blood levels checked (Phenobarbital level) and to see if she should restart this or another medication to prevent seizures.   Ginette Otto, DO IM PGY-2  Maryelizabeth Kaufmann, MD Internal Medicine, PGY1 Pager: [phone] 04/20/2018, 1:26 PM
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
## 2018-04-20 NOTE — Progress Notes (Addendum)
Patient leaving against medical advice. MD Petra Kuba instructed to print off AVS that indicates leaving against medical advice to give to patient. Charge nurse has been notified. AC Chrissie Noa has been notified.

## 2018-06-16 ENCOUNTER — Other Ambulatory Visit: Payer: Self-pay

## 2018-06-16 ENCOUNTER — Emergency Department (HOSPITAL_COMMUNITY)
Admission: EM | Admit: 2018-06-16 | Discharge: 2018-06-17 | Disposition: A | Discharge status: Home / Self Care | Payer: Medicaid Other | Attending: Emergency Medicine | Admitting: Emergency Medicine

## 2018-06-16 ENCOUNTER — Emergency Department (HOSPITAL_COMMUNITY): Payer: Medicaid Other

## 2018-06-16 ENCOUNTER — Encounter (HOSPITAL_COMMUNITY): Payer: Self-pay | Admitting: Emergency Medicine

## 2018-06-16 DIAGNOSIS — Z79899 Other long term (current) drug therapy: Secondary | ICD-10-CM | POA: Diagnosis not present

## 2018-06-16 DIAGNOSIS — F17210 Nicotine dependence, cigarettes, uncomplicated: Secondary | ICD-10-CM | POA: Insufficient documentation

## 2018-06-16 DIAGNOSIS — F10920 Alcohol use, unspecified with intoxication, uncomplicated: Secondary | ICD-10-CM

## 2018-06-16 DIAGNOSIS — R569 Unspecified convulsions: Secondary | ICD-10-CM | POA: Diagnosis not present

## 2018-06-16 LAB — CBC
HCT: 43.4 % (ref 36.0–46.0)
Hemoglobin: 14.5 g/dL (ref 12.0–15.0)
MCH: 32.8 pg (ref 26.0–34.0)
MCHC: 33.4 g/dL (ref 30.0–36.0)
MCV: 98.2 fL (ref 80.0–100.0)
Platelets: 273 10*3/uL (ref 150–400)
RBC: 4.42 MIL/uL (ref 3.87–5.11)
RDW: 15.5 % (ref 11.5–15.5)
WBC: 5.6 10*3/uL (ref 4.0–10.5)
nRBC: 0 % (ref 0.0–0.2)

## 2018-06-16 LAB — COMPREHENSIVE METABOLIC PANEL
ALT: 40 U/L (ref 0–44)
AST: 30 U/L (ref 15–41)
Albumin: 4.1 g/dL (ref 3.5–5.0)
Alkaline Phosphatase: 75 U/L (ref 38–126)
Anion gap: 11 (ref 5–15)
BUN: 19 mg/dL (ref 6–20)
CO2: 23 mmol/L (ref 22–32)
Calcium: 8.1 mg/dL — ABNORMAL LOW (ref 8.9–10.3)
Chloride: 100 mmol/L (ref 98–111)
Creatinine, Ser: 0.57 mg/dL (ref 0.44–1.00)
GFR calc Af Amer: >60 mL/min (ref >60)
GFR calc non Af Amer: >60 mL/min (ref >60)
Glucose, Bld: 80 mg/dL (ref 70–99)
Potassium: 3.8 mmol/L (ref 3.5–5.1)
Sodium: 134 mmol/L — ABNORMAL LOW (ref 135–145)
Total Bilirubin: 0.3 mg/dL (ref 0.3–1.2)
Total Protein: 7 g/dL (ref 6.5–8.1)

## 2018-06-16 LAB — URINALYSIS, ROUTINE W REFLEX MICROSCOPIC
Bilirubin Urine: NEGATIVE
Glucose, UA: NEGATIVE mg/dL
Ketones, ur: NEGATIVE mg/dL
Leukocytes,Ua: NEGATIVE
Nitrite: NEGATIVE
Protein, ur: NEGATIVE mg/dL
Specific Gravity, Urine: 1.009 (ref 1.005–1.030)
pH: 6 (ref 5.0–8.0)

## 2018-06-16 LAB — RAPID URINE DRUG SCREEN, HOSP PERFORMED
Amphetamines: NOT DETECTED
Barbiturates: POSITIVE — AB
Benzodiazepines: NOT DETECTED
Cocaine: POSITIVE — AB
Opiates: NOT DETECTED
Tetrahydrocannabinol: NOT DETECTED

## 2018-06-16 LAB — ETHANOL: Alcohol, Ethyl (B): 305 mg/dL (ref <10)

## 2018-06-16 LAB — CBG MONITORING, ED: Glucose-Capillary: 70 mg/dL (ref 70–99)

## 2018-06-16 LAB — SALICYLATE LEVEL: Salicylate Lvl: <7 mg/dL (ref 2.8–30.0)

## 2018-06-16 LAB — ACETAMINOPHEN LEVEL: Acetaminophen (Tylenol), Serum: <10 ug/mL — ABNORMAL LOW (ref 10–30)

## 2018-06-16 MED ORDER — SODIUM CHLORIDE 0.9 % IV BOLUS
1000.0000 mL | Freq: Once | INTRAVENOUS | Status: AC
  Administered 2018-06-16: 1000 mL via INTRAVENOUS

## 2018-06-16 NOTE — ED Notes (Signed)
Bed: WTR5 Expected date:  Expected time:  Means of arrival:  Comments: 
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
## 2018-06-17 LAB — PHENOBARBITAL LEVEL: Phenobarbital: 55 ug/mL — ABNORMAL HIGH (ref 15.0–30.0)

## 2018-06-17 LAB — PHOSPHORUS: Phosphorus: 4.4 mg/dL (ref 2.5–4.6)

## 2018-06-17 LAB — MAGNESIUM: Magnesium: 2.4 mg/dL (ref 1.7–2.4)

## 2018-06-17 NOTE — ED Notes (Addendum)
Pt asking for cab voucher. Pt also requesting medications for her seizures and states, "If she doesn't give me my medications I am going to blow!" pt ripping off EKG leads and attempting to rip out IV. This RN advised pt not to rip out her IV as she would bleed everywhere.  This RN went to ask Cortni Pa about medications and got [employer] to come stand by triage room for discharge. Cortni PA stated that patient's seizure medication level was high so she will not be ordering her a dose at this time.

## 2018-06-17 NOTE — ED Provider Notes (Signed)
Patient care assumed from [facility], PA-C at shift change with plan to follow-up pending reassessment.  See her note for full H&P.  Briefly, "Sandra Francis is a 58 y.o. female with a history of epilepsy, mitral valve prolapse, phenobarbital overdose who presents to the emergency department by EMS.  [employer] found the patient laying in the street and noted a strong odor of EtOH.  Patient reported to EMS that she had been drinking all day.  In the ER, the patient reports that she had 2 seizures earlier today.  States this is why she was found laying in the road.  Denies a history of recent seizure-like activity prior to today.  Reports she has not taken her phenobarbital since yesterday.  She also endorses alcohol use today and drank "a few beers".  She is unsure of the time of her last drink.  She is also endorsing low back pain.  Abrasions are noted to the bilateral knees.  She is somnolent, but opens her eyes to voice.  Speaks in complete sentences, but speech is slurred.  She is oriented to person, year, city, and place.    The history is provided by the patient. No language interpreter was used"   Physical Exam  BP 112/88 (BP Location: Right Arm)   Pulse 73   Temp 98 F (36.7 C) (Oral)   Resp 16   Ht 5\' 3"  (1.6 m)   Wt 51 kg   SpO2 99%   BMI 19.92 kg/m   Physical Exam Vitals signs and nursing note reviewed.  Constitutional:      General: She is not in acute distress.    Appearance: She is well-developed. She is not ill-appearing or toxic-appearing.  HENT:     Head: Normocephalic and atraumatic.  Eyes:     Conjunctiva/sclera: Conjunctivae normal.  Neck:     Musculoskeletal: Neck supple.  Cardiovascular:     Rate and Rhythm: Normal rate.  Pulmonary:     Effort: Pulmonary effort is normal.  Musculoskeletal: Normal range of motion.  Skin:    General: Skin is warm and dry.  Neurological:     Mental Status: She is alert.     Comments: Sleeping soundly but is easily  arousable.  Speech is clear.  Moving all extremities.  Follows commands.     ED Course/Procedures     Procedures  Results for orders placed or performed during the hospital encounter of 06/16/18  Rapid urine drug screen (hospital performed)  Result Value Ref Range   Opiates NONE DETECTED NONE DETECTED   Cocaine POSITIVE (A) NONE DETECTED   Benzodiazepines NONE DETECTED NONE DETECTED   Amphetamines NONE DETECTED NONE DETECTED   Tetrahydrocannabinol NONE DETECTED NONE DETECTED   Barbiturates POSITIVE (A) NONE DETECTED  Phenobarbital level  Result Value Ref Range   Phenobarbital 55.0 (H) 15.0 - 30.0 ug/mL  Comprehensive metabolic panel  Result Value Ref Range   Sodium 134 (L) 135 - 145 mmol/L   Potassium 3.8 3.5 - 5.1 mmol/L   Chloride 100 98 - 111 mmol/L   CO2 23 22 - 32 mmol/L   Glucose, Bld 80 70 - 99 mg/dL   BUN 19 6 - 20 mg/dL   Creatinine, Ser 0.57 0.44 - 1.00 mg/dL   Calcium 8.1 (L) 8.9 - 10.3 mg/dL   Total Protein 7.0 6.5 - 8.1 g/dL   Albumin 4.1 3.5 - 5.0 g/dL   AST 30 15 - 41 U/L   ALT 40 0 - 44 U/L  Alkaline Phosphatase 75 38 - 126 U/L   Total Bilirubin 0.3 0.3 - 1.2 mg/dL   GFR calc non Af Amer >60 >60 mL/min   GFR calc Af Amer >60 >60 mL/min   Anion gap 11 5 - 15  CBC  Result Value Ref Range   WBC 5.6 4.0 - 10.5 K/uL   RBC 4.42 3.87 - 5.11 MIL/uL   Hemoglobin 14.5 12.0 - 15.0 g/dL   HCT 78.243.4 95.636.0 - 21.346.0 %   MCV 98.2 80.0 - 100.0 fL   MCH 32.8 26.0 - 34.0 pg   MCHC 33.4 30.0 - 36.0 g/dL   RDW 08.615.5 57.811.5 - 46.915.5 %   Platelets 273 150 - 400 K/uL   nRBC 0.0 0.0 - 0.2 %  Acetaminophen level  Result Value Ref Range   Acetaminophen (Tylenol), Serum <10 (L) 10 - 30 ug/mL  Salicylate level  Result Value Ref Range   Salicylate Lvl <7.0 2.8 - 30.0 mg/dL  Ethanol  Result Value Ref Range   Alcohol, Ethyl (B) 305 (HH) <10 mg/dL  Phosphorus  Result Value Ref Range   Phosphorus 4.4 2.5 - 4.6 mg/dL  Magnesium  Result Value Ref Range   Magnesium 2.4 1.7 - 2.4  mg/dL  Urinalysis, Routine w reflex microscopic  Result Value Ref Range   Color, Urine STRAW (A) YELLOW   APPearance CLEAR CLEAR   Specific Gravity, Urine 1.009 1.005 - 1.030   pH 6.0 5.0 - 8.0   Glucose, UA NEGATIVE NEGATIVE mg/dL   Hgb urine dipstick SMALL (A) NEGATIVE   Bilirubin Urine NEGATIVE NEGATIVE   Ketones, ur NEGATIVE NEGATIVE mg/dL   Protein, ur NEGATIVE NEGATIVE mg/dL   Nitrite NEGATIVE NEGATIVE   Leukocytes,Ua NEGATIVE NEGATIVE   RBC / HPF 0-5 0 - 5 RBC/hpf   WBC, UA 0-5 0 - 5 WBC/hpf   Bacteria, UA RARE (A) NONE SEEN   Squamous Epithelial / LPF 0-5 0 - 5   Mucus PRESENT   CBG monitoring, ED  Result Value Ref Range   Glucose-Capillary 70 70 - 99 mg/dL   Comment 1 Notify RN    Comment 2 Document in Chart    Dg Lumbar Spine Complete  Result Date: 06/16/2018 CLINICAL DATA:  58 year old female found down. Seizure like activity and back pain. EXAM: LUMBAR SPINE - COMPLETE 4+ VIEW COMPARISON:  CT Abdomen and Pelvis 01/22/2018 and earlier. FINDINGS: Osteopenia. Normal lumbar segmentation. Stable vertebral height and alignment. Advanced chronic disc and endplate degeneration redemonstrated at L4-L5 with degenerative vertebral sclerosis. Chronic endplate irregularity superiorly at L2 is stable. No pars fracture. Sacrum and SI joints appear stable visible lower thoracic levels appear grossly intact. Negative abdominal visceral contour. IMPRESSION: 1. No acute osseous abnormality identified in the lumbar spine. 2. 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 Electronically Signed   By: Odessa FlemingH  Hall M.D.   On: 06/16/2018 23:34     MDM   58 year old female presenting after being found by [employer].  Reportedly had 2 episodes of seizure-like activity prior to arrival.  States that she had not taken her phenobarbital.  She was also drinking alcohol.  Work-up thus far has been reassuring with phenobarb level at 55 which is significantly less than previous levels.  UDS positive for  cocaine barbiturates.  EtOH was 305.  CBC, CMP nonacute.  Acetaminophen salicylate levels are negative.  Phosphorus and mag are normal.  UA is not suggestive of UTI.  X-ray of the lumbar spine shows no acute process.  Patient  has been observed in the ED for greater than 10 hours.  She has had no evidence of seizure-like activity.  She has been able to ambulate in the ED to the bathroom and she has also eaten breakfast without difficulty.  Speech is clear. She is now clinically sober and is appropriate for discharge.  Given resources for substance use.  Advised to follow-up with PCP and return if worse.     Rayne DuCouture, Keimani Laufer S, PA-C 06/17/18 0827    Azalia Bilisampos, Kevin, MD 06/17/18 [phone]

## 2018-06-17 NOTE — ED Notes (Signed)
ED Provider at bedside. 

## 2019-02-10 DEATH — deceased
